# Patient Record
Sex: Male | Born: 1980 | Race: White | Hispanic: No | Marital: Married | Smoking: Former smoker
Health system: Southern US, Community
[De-identification: ages and names within clinical notes are randomized; demographics above are authoritative.]

## PROBLEM LIST (undated history)

## (undated) DIAGNOSIS — T7840XA Allergy, unspecified, initial encounter: Secondary | ICD-10-CM

## (undated) DIAGNOSIS — K219 Gastro-esophageal reflux disease without esophagitis: Secondary | ICD-10-CM

## (undated) HISTORY — DX: Gastro-esophageal reflux disease without esophagitis: K21.9

## (undated) HISTORY — DX: Allergy, unspecified, initial encounter: T78.40XA

---
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
## 2013-04-24 LAB — HEPATIC FUNCTION PANEL
ALT: 25 (ref 10–40)
AST: 24 (ref 14–40)
Alkaline Phosphatase: 54 (ref 25–125)
Bilirubin, Total: 0.5

## 2013-04-24 LAB — CBC AND DIFFERENTIAL
HCT: 41 (ref 41–53)
Hemoglobin: 14.5 (ref 13.5–17.5)
Platelets: 248 (ref 150–399)
WBC: 6.6

## 2013-04-24 LAB — TSH: TSH: 1.7 (ref 0.41–5.90)

## 2013-04-24 LAB — BASIC METABOLIC PANEL
BUN: 17 (ref 4–21)
CO2: 23 — AB (ref 13–22)
Chloride: 105 (ref 99–108)
Creatinine: 1.1 (ref 0.6–1.3)
Glucose: 93
Potassium: 4 (ref 3.4–5.3)
Sodium: 138 (ref 137–147)

## 2013-04-24 LAB — LIPID PANEL
Cholesterol: 143 (ref 0–200)
HDL: 41 (ref 35–70)
LDL Cholesterol: 88
Triglycerides: 71 (ref 40–160)

## 2013-04-24 LAB — COMPREHENSIVE METABOLIC PANEL
Albumin: 4.6 (ref 3.5–5.0)
Calcium: 9.6 (ref 8.7–10.7)
GFR calc non Af Amer: 78

## 2014-07-07 LAB — COMPREHENSIVE METABOLIC PANEL
Albumin: 4.5 (ref 3.5–5.0)
Calcium: 10.1 (ref 8.7–10.7)
GFR calc non Af Amer: 70

## 2014-07-07 LAB — LIPID PANEL
Cholesterol: 144 (ref 0–200)
HDL: 46 (ref 35–70)
LDL Cholesterol: 79
Triglycerides: 93 (ref 40–160)

## 2014-07-07 LAB — CBC AND DIFFERENTIAL
HCT: 42 (ref 41–53)
Hemoglobin: 14.4 (ref 13.5–17.5)
Platelets: 227 (ref 150–399)
WBC: 6.3

## 2014-07-07 LAB — BASIC METABOLIC PANEL
BUN: 24 — AB (ref 4–21)
CO2: 23 — AB (ref 13–22)
Chloride: 105 (ref 99–108)
Creatinine: 1.2 (ref 0.6–1.3)
Glucose: 80
Potassium: 4 (ref 3.4–5.3)
Sodium: 140 (ref 137–147)

## 2014-07-07 LAB — HEPATIC FUNCTION PANEL
ALT: 28 (ref 10–40)
AST: 28 (ref 14–40)
Alkaline Phosphatase: 74 (ref 25–125)
Bilirubin, Total: 0.5

## 2014-07-07 LAB — TSH: TSH: 1.59 (ref 0.41–5.90)

## 2015-01-25 HISTORY — PX: VASECTOMY: SHX75

## 2015-06-29 LAB — COMPREHENSIVE METABOLIC PANEL
Albumin: 4.5 (ref 3.5–5.0)
Calcium: 9.6 (ref 8.7–10.7)

## 2015-06-29 LAB — HEPATIC FUNCTION PANEL
ALT: 31 (ref 10–40)
AST: 27 (ref 14–40)
Alkaline Phosphatase: 65 (ref 25–125)
Bilirubin, Total: 0.7

## 2015-06-29 LAB — BASIC METABOLIC PANEL
BUN: 17 (ref 4–21)
CO2: 23 — AB (ref 13–22)
Chloride: 104 (ref 99–108)
Creatinine: 1 (ref 0.6–1.3)
Glucose: 92
Potassium: 3.7 (ref 3.4–5.3)
Sodium: 139 (ref 137–147)

## 2015-06-29 LAB — LIPID PANEL
Cholesterol: 159 (ref 0–200)
HDL: 48 (ref 35–70)
LDL Cholesterol: 94
Triglycerides: 84 (ref 40–160)

## 2015-06-29 LAB — CBC AND DIFFERENTIAL
HCT: 42 (ref 41–53)
Hemoglobin: 14.2 (ref 13.5–17.5)
Neutrophils Absolute: 5
Platelets: 201 (ref 150–399)
WBC: 7.3

## 2015-06-29 LAB — TSH: TSH: 2.41 (ref 0.41–5.90)

## 2015-06-29 LAB — CBC: RBC: 4.65 (ref 3.87–5.11)

## 2016-08-17 LAB — LIPID PANEL
Cholesterol: 170 (ref 0–200)
HDL: 48 (ref 35–70)
LDL Cholesterol: 104
Triglycerides: 92 (ref 40–160)

## 2016-08-17 LAB — COMPREHENSIVE METABOLIC PANEL
Albumin: 4.5 (ref 3.5–5.0)
Calcium: 10.1 (ref 8.7–10.7)

## 2016-08-17 LAB — CBC AND DIFFERENTIAL
HCT: 43 (ref 41–53)
Hemoglobin: 14.8 (ref 13.5–17.5)
Neutrophils Absolute: 3
Platelets: 220 (ref 150–399)
WBC: 5.8

## 2016-08-17 LAB — HEPATIC FUNCTION PANEL
ALT: 30 (ref 10–40)
AST: 31 (ref 14–40)
Alkaline Phosphatase: 55 (ref 25–125)
Bilirubin, Total: 0.6

## 2016-08-17 LAB — BASIC METABOLIC PANEL
BUN: 21 (ref 4–21)
CO2: 26 — AB (ref 13–22)
Chloride: 103 (ref 99–108)
Creatinine: 1.1 (ref 0.6–1.3)
Glucose: 88
Potassium: 4.8 (ref 3.4–5.3)
Sodium: 139 (ref 137–147)

## 2016-08-17 LAB — TSH: TSH: 1.74 (ref 0.41–5.90)

## 2016-08-17 LAB — CBC: RBC: 4.73 (ref 3.87–5.11)

## 2017-11-29 LAB — CBC AND DIFFERENTIAL
HCT: 42 (ref 41–53)
Hemoglobin: 13.9 (ref 13.5–17.5)
Neutrophils Absolute: 4
Platelets: 240 (ref 150–399)
WBC: 5.9

## 2017-11-29 LAB — HEPATIC FUNCTION PANEL
ALT: 16 (ref 10–40)
AST: 26 (ref 14–40)
Alkaline Phosphatase: 53 (ref 25–125)
Bilirubin, Total: 0.3

## 2017-11-29 LAB — COMPREHENSIVE METABOLIC PANEL
Albumin: 4.7 (ref 3.5–5.0)
Calcium: 9.8 (ref 8.7–10.7)

## 2017-11-29 LAB — BASIC METABOLIC PANEL
BUN: 21 (ref 4–21)
CO2: 21 (ref 13–22)
Chloride: 106 (ref 99–108)
Creatinine: 0.9 (ref 0.6–1.3)
Glucose: 98
Potassium: 4.1 (ref 3.4–5.3)
Sodium: 140 (ref 137–147)

## 2017-11-29 LAB — LIPID PANEL
Cholesterol: 167 (ref 0–200)
HDL: 50 (ref 35–70)
LDL Cholesterol: 101
Triglycerides: 78 (ref 40–160)

## 2017-11-29 LAB — CBC: RBC: 4.55 (ref 3.87–5.11)

## 2017-11-29 LAB — TSH: TSH: 1.31 (ref 0.41–5.90)

## 2019-07-03 ENCOUNTER — Ambulatory Visit (INDEPENDENT_AMBULATORY_CARE_PROVIDER_SITE_OTHER): Payer: Commercial Managed Care - PPO | Admitting: Family Medicine

## 2019-07-03 ENCOUNTER — Other Ambulatory Visit: Payer: Self-pay

## 2019-07-03 ENCOUNTER — Encounter: Payer: Self-pay | Admitting: Family Medicine

## 2019-07-03 VITALS — BP 116/80 | HR 69 | Temp 97.8°F | Resp 16 | Ht 71.0 in | Wt 176.6 lb

## 2019-07-03 DIAGNOSIS — K219 Gastro-esophageal reflux disease without esophagitis: Secondary | ICD-10-CM

## 2019-07-03 DIAGNOSIS — J3089 Other allergic rhinitis: Secondary | ICD-10-CM

## 2019-07-03 DIAGNOSIS — Z7689 Persons encountering health services in other specified circumstances: Secondary | ICD-10-CM | POA: Diagnosis not present

## 2019-07-03 MED ORDER — ESOMEPRAZOLE MAGNESIUM 40 MG PO CPDR: 40.0000 mg | DELAYED_RELEASE_CAPSULE | Freq: Every day | ORAL | 3 refills | Status: DC

## 2019-07-03 NOTE — Progress Notes (Signed)
Subjective:    Patient ID: Reginald Yoder, male    DOB: 06-23-1980, 39 y.o.   MRN: [id number]  Reginald Yoder is a 39 y.o. male presenting on 07/03/2019 for Establish Care (GERD)  Moved here to [facility] in January 2021 from [place], previous PCP was Lupita Shutter NP ([place])  HPI   GERD Past history initially in 2009 diagnosed. He has had upper GI endoscopy done in 09-10 in [place], no repeat done since then. Confirmed his diagnosis. No complications that would warrant repeat. He has not had any issues with this GERD since on medication. Previous symptoms with heartburn and chest tightness with physical activity and build up of gas. Improved with belching. He was on Zantac BID for past 9 years, then he switched to Omeprazole 20mg  - then wasn't covered. He was switched to generic Esomeprazole 20mg  daily but was ineffective and increased to 40mg  daily about 1.5 years ago, and now doing well. - He has tried to adjust his diet but didn't quite resolve the problem. - He has some fam history with similar problem GERD, grandfather and aunts  History of Sinusitis / Allergies Usually October seasonally would get sinus infection.  Last lab panel biometric in approx Feb 2020.  He currently works at [employer], he was working at [employer] in [place] previously now new job.  Wife has an aunt in [place] His family has an aunt in [place]  Generally active. He does like to keep at gym and do strength training. Goal to go hiking with wife outdoors. He is active playing sports basketball  History of COVID19 02/2019  Health Maintenance: No updated COVID19  Tdap 2018-2019, review record.  Depression screen PHQ 2/9 07/03/2019  Decreased Interest 0  Down, Depressed, Hopeless 0  PHQ - 2 Score 0    Past Medical History:  Diagnosis Date  . Allergy   . GERD (gastroesophageal reflux disease)    Past Surgical History:  Procedure  Laterality Date  . TONSILECTOMY, ADENOIDECTOMY, BILATERAL MYRINGOTOMY AND TUBES    . VASECTOMY    . WISDOM TOOTH EXTRACTION     Social History   Socioeconomic History  . Marital status: Married    Spouse name: Not on file  . Number of children: Not on file  . Years of education: Not on file  . Highest education level: Not on file  Occupational History  . Not on file  Tobacco Use  . Smoking status: Former Research scientist (life sciences)  . Smokeless tobacco: Former Systems developer    Types: Chew  Substance and Sexual Activity  . Alcohol use: Yes  . Drug use: Not Currently    Types: Marijuana  . Sexual activity: Not on file  Other Topics Concern  . Not on file  Social History Narrative  . Not on file   Social Determinants of Health   Financial Resource Strain:   . Difficulty of Paying Living Expenses:   Food Insecurity:   . Worried About Charity fundraiser in the Last Year:   . Arboriculturist in the Last Year:   Transportation Needs:   . Film/video editor (Medical):   Marland Kitchen Lack of Transportation (Non-Medical):   Physical Activity:   . Days of Exercise per Week:   . Minutes of Exercise per Session:   Stress:   . Feeling of Stress :   Social Connections:   . Frequency of Communication with Friends and Family:   .  Frequency of Social Gatherings with Friends and Family:   . Attends Religious Services:   . Active Member of Clubs or Organizations:   . Attends Banker Meetings:   Marland Kitchen Marital Status:   Intimate Partner Violence:   . Fear of Current or Ex-Partner:   . Emotionally Abused:   Marland Kitchen Physically Abused:   . Sexually Abused:    Family History  Problem Relation Age of Onset  . Lung cancer Maternal Grandfather 53       smoker  . Heart attack Maternal Grandfather   . Prostate cancer Neg Hx   . Colon cancer Neg Hx    Current Outpatient Medications on File Prior to Visit  Medication Sig  . cetirizine (ZYRTEC) 5 MG tablet Take 5 mg by mouth daily.   No current  facility-administered medications on file prior to visit.    Review of Systems  Constitutional: Negative for activity change, appetite change, chills, diaphoresis, fatigue and fever.  HENT: Negative for congestion and hearing loss.   Eyes: Negative for visual disturbance.  Respiratory: Negative for apnea, cough, chest tightness, shortness of breath and wheezing.   Cardiovascular: Negative for chest pain, palpitations and leg swelling.  Gastrointestinal: Negative for abdominal pain, anal bleeding, blood in stool, constipation, diarrhea, nausea and vomiting.       Heartburn controlled  Endocrine: Negative for cold intolerance.  Genitourinary: Negative for difficulty urinating, dysuria, frequency and hematuria.  Musculoskeletal: Negative for arthralgias, back pain and neck pain.  Skin: Negative for rash.  Allergic/Immunologic: Positive for environmental allergies.  Neurological: Negative for dizziness, weakness, light-headedness, numbness and headaches.  Hematological: Negative for adenopathy.  Psychiatric/Behavioral: Negative for behavioral problems, dysphoric mood and sleep disturbance. The patient is not nervous/anxious.    Per HPI unless specifically indicated above      Objective:    BP 116/80   Pulse 69   Temp 97.8 F (36.6 C) (Temporal)   Resp 16   Ht 5\' 11"  (1.803 m)   Wt 176 lb 9.6 oz (80.1 kg)   SpO2 99%   BMI 24.63 kg/m   Wt Readings from Last 3 Encounters:  07/03/19 176 lb 9.6 oz (80.1 kg)    Physical Exam Vitals and nursing note reviewed.  Constitutional:      General: He is not in acute distress.    Appearance: He is well-developed. He is not diaphoretic.     Comments: Well-appearing, comfortable, cooperative  HENT:     Head: Normocephalic and atraumatic.  Eyes:     General:        Right eye: No discharge.        Left eye: No discharge.     Conjunctiva/sclera: Conjunctivae normal.     Pupils: Pupils are equal, round, and reactive to light.  Neck:      Thyroid: No thyromegaly.  Cardiovascular:     Rate and Rhythm: Normal rate and regular rhythm.     Heart sounds: Normal heart sounds. No murmur.  Pulmonary:     Effort: Pulmonary effort is normal. No respiratory distress.     Breath sounds: Normal breath sounds. No wheezing or rales.  Abdominal:     General: Bowel sounds are normal. There is no distension.     Palpations: Abdomen is soft. There is no mass.     Tenderness: There is no abdominal tenderness.  Musculoskeletal:        General: No tenderness. Normal range of motion.     Cervical back: Normal range  of motion and neck supple.     Comments: Upper / Lower Extremities: - Normal muscle tone, strength bilateral upper extremities 5/5, lower extremities 5/5  Lymphadenopathy:     Cervical: No cervical adenopathy.  Skin:    General: Skin is warm and dry.     Findings: No erythema or rash.  Neurological:     Mental Status: He is alert and oriented to person, place, and time.     Comments: Distal sensation intact to light touch all extremities  Psychiatric:        Behavior: Behavior normal.     Comments: Well groomed, good eye contact, normal speech and thoughts        No results found for this or any previous visit.    Assessment & Plan:   Problem List Items Addressed This Visit    Gastroesophageal reflux disease - Primary    Stable chronic GERD, controlled on PPI Prior GI [place], EGD initial 2009-2010. No complications. Can request record if need. No repeat required based on his report.  Plan: 1. Continue generic Esomeprazole 40mg  daily before breakfast 90 +3 refill to [facility] 2. Diet modifications reduce GERD 3. Reviewed treatment goal with PPI, may continue longer term if indicated. Future can reconsider dosing and med and consider refer to local GI for consultation if need.      Relevant Medications   esomeprazole (NEXIUM) 40 MG capsule   Environmental and seasonal allergies    Other Visit Diagnoses     Encounter to establish care with new doctor          Request records from outside PCP   Meds ordered this encounter  Medications  . esomeprazole (NEXIUM) 40 MG capsule    Sig: Take 1 capsule (40 mg total) by mouth daily before breakfast.    Dispense:  90 capsule    Refill:  3     Follow up plan: Return in about 1 year (around 07/02/2020) for Annual Physical.   May return sooner after July 2021 after [facility] for work, initial labs, may warrant lab draw here after follow-up and review of his record. Or can resume yearly follow-up in 2022.  2023, DO [facility] 07/03/2019, 3:38 PM

## 2019-07-03 NOTE — Patient Instructions (Addendum)
Thank you for coming to the office today.  Ordered generic nexium, 90 day + 3 refills [facility]   Please schedule a Follow-up Appointment to: Return in about 1 year (around 07/02/2020) for Annual Physical.  If you have any other questions or concerns, please feel free to call the office or send a message through MyChart. You may also schedule an earlier appointment if necessary.  Additionally, you may be receiving a survey about your experience at our office within a few days to 1 week by e-mail or mail. We value your feedback.  Saralyn Pilar, DO [facility], [place]

## 2019-07-03 NOTE — Assessment & Plan Note (Signed)
Stable chronic GERD, controlled on PPI Prior GI [place], EGD initial 2009-2010. No complications. Can request record if need. No repeat required based on his report.  Plan: 1. Continue generic Esomeprazole 40mg  daily before breakfast 90 +3 refill to [facility] 2. Diet modifications reduce GERD 3. Reviewed treatment goal with PPI, may continue longer term if indicated. Future can reconsider dosing and med and consider refer to local GI for consultation if need.

## 2019-07-14 ENCOUNTER — Encounter: Payer: Self-pay | Admitting: Family Medicine

## 2019-07-14 DIAGNOSIS — H33311 Horseshoe tear of retina without detachment, right eye: Secondary | ICD-10-CM | POA: Insufficient documentation

## 2019-08-14 LAB — LIPID PANEL
Cholesterol: 188 (ref 0–200)
HDL: 44 (ref 35–70)
LDL Cholesterol: 125
LDl/HDL Ratio: 4.3
Triglycerides: 97 (ref 40–160)

## 2019-08-14 LAB — BASIC METABOLIC PANEL: Glucose: 93

## 2019-10-10 ENCOUNTER — Other Ambulatory Visit: Payer: Self-pay

## 2019-10-10 ENCOUNTER — Ambulatory Visit (INDEPENDENT_AMBULATORY_CARE_PROVIDER_SITE_OTHER): Payer: Commercial Managed Care - PPO

## 2019-10-10 ENCOUNTER — Ambulatory Visit
Admission: EM | Admit: 2019-10-10 | Discharge: 2019-10-10 | Disposition: A | Discharge status: Home / Self Care | Payer: Commercial Managed Care - PPO | Attending: Family Medicine | Admitting: Family Medicine

## 2019-10-10 DIAGNOSIS — S90111A Contusion of right great toe without damage to nail, initial encounter: Secondary | ICD-10-CM

## 2019-10-10 DIAGNOSIS — M79674 Pain in right toe(s): Secondary | ICD-10-CM

## 2019-10-10 MED ORDER — MELOXICAM 15 MG PO TABS: 15.0000 mg | ORAL_TABLET | Freq: Every day | ORAL | 0 refills | Status: DC | PRN

## 2019-10-10 NOTE — ED Triage Notes (Signed)
Patient in today w/ right foot injury. Patient states he was at the pool and slipped on the deck yesterday.

## 2019-10-10 NOTE — Discharge Instructions (Addendum)
Rest, ice, elevation. ° °Medication as directed. ° °Take care ° °Dr. Talmage Teaster  °

## 2019-10-11 ENCOUNTER — Encounter: Payer: Self-pay | Admitting: Family Medicine

## 2019-10-11 NOTE — ED Provider Notes (Signed)
[facility]    CSN: [id number] Arrival date & time: 10/10/19  1617  History   Chief Complaint Chief Complaint  Patient presents with  . Foot Injury    Right   HPI  39 year old male presents with the above complaint.  Patient reports that he was at the pool yesterday.  He slipped and in doing so he injured his right great toe.  Pain 2/10 in severity. Associated bruising. No relieving factors. No other complaints.    Past Medical History:  Diagnosis Date  . Allergy   . GERD (gastroesophageal reflux disease)    Patient Active Problem List   Diagnosis Date Noted  . Partial retinal tear of right eye without detachment 07/14/2019  . Gastroesophageal reflux disease 07/03/2019  . Environmental and seasonal allergies 07/03/2019   Past Surgical History:  Procedure Laterality Date  . TONSILLECTOMY Bilateral 1987  . VASECTOMY  2017  . WISDOM TOOTH EXTRACTION Bilateral 2002    Home Medications    Prior to Admission medications   Medication Sig Start Date End Date Taking? Authorizing Provider  cetirizine (ZYRTEC) 5 MG tablet Take 5 mg by mouth daily.   Yes [provider]  esomeprazole (NEXIUM) 40 MG capsule Take 1 capsule (40 mg total) by mouth daily before breakfast. 07/03/19  Yes Karamalegos, Netta Neat, DO  meloxicam (MOBIC) 15 MG tablet Take 1 tablet (15 mg total) by mouth daily as needed for pain. 10/10/19   Tommie Sams, DO    Family History Family History  Problem Relation Age of Onset  . Lung cancer Maternal Grandfather 7       smoker  . Heart attack Maternal Grandfather   . Prostate cancer Neg Hx   . Colon cancer Neg Hx     Social History Social History   Tobacco Use  . Smoking status: Former Games developer  . Smokeless tobacco: Former Neurosurgeon    Types: Chew  . Tobacco comment: chewing tobacco for 1 year  Substance Use Topics  . Alcohol use: Yes    Alcohol/week: 1.0 - 2.0 standard drink    Types: 1 - 2 Standard drinks or equivalent per week    . Drug use: Not Currently    Types: Marijuana     Allergies   Seasonal ic [cholestatin]   Review of Systems Review of Systems  Constitutional: Negative.   Musculoskeletal:       Great toe pain/injury (right).   Physical Exam Triage Vital Signs ED Triage Vitals  Enc Vitals Group     BP 10/10/19 1649 (!) 127/98     Pulse Rate 10/10/19 1649 62     Resp 10/10/19 1649 16     Temp 10/10/19 1649 98.3 F (36.8 C)     Temp Source 10/10/19 1649 Oral     SpO2 10/10/19 1649 100 %     Weight 10/10/19 1647 175 lb (79.4 kg)     Height 10/10/19 1647 5\' 11"  (1.803 m)     Head Circumference --      Peak Flow --      Pain Score 10/10/19 1647 2     Pain Loc --      Pain Edu? --      Excl. in GC? --    Updated Vital Signs BP (!) 127/98 (BP Location: Left Arm)   Pulse 62   Temp 98.3 F (36.8 C) (Oral)   Resp 16   Ht 5\' 11"  (1.803 m)   Wt 79.4 kg  SpO2 100%   BMI 24.41 kg/m   Visual Acuity Right Eye Distance:   Left Eye Distance:   Bilateral Distance:    Right Eye Near:   Left Eye Near:    Bilateral Near:     Physical Exam Vitals and nursing note reviewed.  Constitutional:      General: He is not in acute distress.    Appearance: Normal appearance. He is not ill-appearing.  HENT:     Head: Normocephalic and atraumatic.  Pulmonary:     Effort: Pulmonary effort is normal. No respiratory distress.  Musculoskeletal:     Comments: Right great toe - bruising noted on the dorsum of the toe. Normal ROM.   Neurological:     Mental Status: He is alert.  Psychiatric:        Mood and Affect: Mood normal.        Behavior: Behavior normal.    UC Treatments / Results  Labs (all labs ordered are listed, but only abnormal results are displayed) Labs Reviewed - No data to display  EKG   Radiology DG Toe Great Right  Result Date: 10/10/2019 CLINICAL DATA:  Fall, pain EXAM: RIGHT GREAT TOE COMPARISON:  None. FINDINGS: There is no evidence of fracture or dislocation. There  is no evidence of arthropathy or other focal bone abnormality. Soft tissue edema about the digit. IMPRESSION: No fracture or dislocation of the right great toe. Soft tissue edema. Electronically Signed   By: Lauralyn Primes M.D.   On: 10/10/2019 18:30    Procedures Procedures (including critical care time)  Medications Ordered in UC Medications - No data to display  Initial Impression / Assessment and Plan / UC Course  I have reviewed the triage vital signs and the nursing notes.  Pertinent labs & imaging results that were available during my care of the patient were reviewed by me and considered in my medical decision making (see chart for details).    39 year old male presents with a contusion of the right great toe.  X-ray was obtained today to ensure no fracture.  Interpretation: No acute findings.  No fracture.  Advise rest, ice, elevation.  Meloxicam as directed.  Supportive care.  Final Clinical Impressions(s) / UC Diagnoses   Final diagnoses:  Contusion of right great toe without damage to nail, initial encounter     Discharge Instructions     Rest, ice, elevation.  Medication as directed.  Take care  Dr. Adriana Simas    ED Prescriptions    Medication Sig Dispense Auth. Provider   meloxicam (MOBIC) 15 MG tablet Take 1 tablet (15 mg total) by mouth daily as needed for pain. 30 tablet Tommie Sams, DO     PDMP not reviewed this encounter.   Tommie Sams, [place] 10/11/19 1235

## 2020-05-27 ENCOUNTER — Other Ambulatory Visit: Payer: Self-pay

## 2020-05-27 ENCOUNTER — Ambulatory Visit
Admission: EM | Admit: 2020-05-27 | Discharge: 2020-05-27 | Disposition: A | Discharge status: Home / Self Care | Payer: Commercial Managed Care - PPO | Attending: Family Medicine | Admitting: Family Medicine

## 2020-05-27 ENCOUNTER — Encounter: Payer: Self-pay | Admitting: Emergency Medicine

## 2020-05-27 DIAGNOSIS — R350 Frequency of micturition: Secondary | ICD-10-CM | POA: Insufficient documentation

## 2020-05-27 LAB — URINALYSIS, COMPLETE (UACMP) WITH MICROSCOPIC
Bacteria, UA: NONE SEEN
Glucose, UA: NEGATIVE mg/dL
Hgb urine dipstick: NEGATIVE
Ketones, ur: NEGATIVE mg/dL
Leukocytes,Ua: NEGATIVE
Protein, ur: NEGATIVE mg/dL
Specific Gravity, Urine: >1.03 — ABNORMAL HIGH (ref 1.005–1.030)
Squamous Epithelial / HPF: NONE SEEN (ref 0–5)
pH: 5.5 (ref 5.0–8.0)

## 2020-05-27 NOTE — Discharge Instructions (Signed)
Lots of fluids.  Awaiting culture results.  Take care  Dr. Adriana Simas

## 2020-05-27 NOTE — ED Provider Notes (Signed)
[facility]    CSN: [id number] Arrival date & time: 05/27/20  1850      History   Chief Complaint Chief Complaint  Patient presents with  . Dysuria   HPI  40 year old male presents with urinary frequency.  Patient reports symptoms for the past 2 days.  Notes frequent urination.  Some mild discomfort when he urinates.  He has taken Azo without relief.  Denies concerns for STD.  No relieving factors.  No known inciting factor.  No other complaints.  Past Medical History:  Diagnosis Date  . Allergy   . GERD (gastroesophageal reflux disease)     Patient Active Problem List   Diagnosis Date Noted  . Partial retinal tear of right eye without detachment 07/14/2019  . Gastroesophageal reflux disease 07/03/2019  . Environmental and seasonal allergies 07/03/2019    Past Surgical History:  Procedure Laterality Date  . TONSILLECTOMY Bilateral 1987  . VASECTOMY  2017  . WISDOM TOOTH EXTRACTION Bilateral 2002       Home Medications    Prior to Admission medications   Medication Sig Start Date End Date Taking? Authorizing Provider  cetirizine (ZYRTEC) 5 MG tablet Take 5 mg by mouth daily.    [provider]  esomeprazole (NEXIUM) 40 MG capsule Take 1 capsule (40 mg total) by mouth daily before breakfast. 07/03/19   Althea Charon Netta Neat, DO    Family History Family History  Problem Relation Age of Onset  . Lung cancer Maternal Grandfather 45       smoker  . Heart attack Maternal Grandfather   . Prostate cancer Neg Hx   . Colon cancer Neg Hx     Social History Social History   Tobacco Use  . Smoking status: Former Games developer  . Smokeless tobacco: Former Neurosurgeon    Types: Chew  . Tobacco comment: chewing tobacco for 1 year  Substance Use Topics  . Alcohol use: Yes    Alcohol/week: 1.0 - 2.0 standard drink    Types: 1 - 2 Standard drinks or equivalent per week  . Drug use: Not Currently    Types: Marijuana     Allergies   Seasonal ic  [cholestatin]   Review of Systems Review of Systems  Constitutional: Negative.   Genitourinary: Positive for frequency.   Physical Exam Triage Vital Signs ED Triage Vitals  Enc Vitals Group     BP 05/27/20 1904 120/86     Pulse Rate 05/27/20 1904 (!) 54     Resp 05/27/20 1904 17     Temp 05/27/20 1904 97.8 F (36.6 C)     Temp src --      SpO2 05/27/20 1904 100 %     Weight --      Height --      Head Circumference --      Peak Flow --      Pain Score 05/27/20 1903 0     Pain Loc --      Pain Edu? --      Excl. in GC? --    Updated Vital Signs BP 120/86 (BP Location: Left Arm)   Pulse (!) 54   Temp 97.8 F (36.6 C)   Resp 17   SpO2 100%   Visual Acuity Right Eye Distance:   Left Eye Distance:   Bilateral Distance:    Right Eye Near:   Left Eye Near:    Bilateral Near:     Physical Exam Vitals and nursing note reviewed.  Constitutional:      General: He is not in acute distress.    Appearance: Normal appearance. He is not ill-appearing.  HENT:     Head: Normocephalic and atraumatic.  Eyes:     General:        Right eye: No discharge.        Left eye: No discharge.     Conjunctiva/sclera: Conjunctivae normal.  Cardiovascular:     Rate and Rhythm: Regular rhythm. Bradycardia present.  Pulmonary:     Effort: Pulmonary effort is normal.     Breath sounds: Normal breath sounds. No wheezing, rhonchi or rales.  Abdominal:     General: There is no distension.     Palpations: Abdomen is soft.     Tenderness: There is no abdominal tenderness.  Neurological:     Mental Status: He is alert.  Psychiatric:        Mood and Affect: Mood normal.        Behavior: Behavior normal.    UC Treatments / Results  Labs (all labs ordered are listed, but only abnormal results are displayed) Labs Reviewed  URINALYSIS, COMPLETE (UACMP) WITH MICROSCOPIC - Abnormal; Notable for the following components:      Result Value   Color, Urine AMBER (*)    Specific Gravity,  Urine >1.030 (*)    Bilirubin Urine SMALL (*)    Nitrite   (*)    Value: TEST NOT REPORTED DUE TO COLOR INTERFERENCE OF URINE PIGMENT   All other components within normal limits  URINE CULTURE    EKG   Radiology No results found.  Procedures Procedures (including critical care time)  Medications Ordered in UC Medications - No data to display  Initial Impression / Assessment and Plan / UC Course  I have reviewed the triage vital signs and the nursing notes.  Pertinent labs & imaging results that were available during my care of the patient were reviewed by me and considered in my medical decision making (see chart for details).    40 year old male presents with urinary frequency.  His urinalysis is not consistent with UTI.  Sending culture.  Advise hydration.  Supportive care.  Final Clinical Impressions(s) / UC Diagnoses   Final diagnoses:  Urinary frequency     Discharge Instructions     Lots of fluids.  Awaiting culture results.  Take care  Dr. Adriana Simas    ED Prescriptions    None     PDMP not reviewed this encounter.   Tommie Sams, [place] 05/27/20 1950

## 2020-05-27 NOTE — ED Triage Notes (Signed)
Pt is present today with frequent urination and dysuria. Pt states his sx started a couple days ago.

## 2020-05-29 LAB — URINE CULTURE: Culture: NO GROWTH

## 2020-07-06 ENCOUNTER — Other Ambulatory Visit: Payer: Self-pay | Admitting: Family Medicine

## 2020-07-06 DIAGNOSIS — K219 Gastro-esophageal reflux disease without esophagitis: Secondary | ICD-10-CM

## 2020-07-06 NOTE — Telephone Encounter (Signed)
  Last refill: 02/29/2020  Future visit scheduled:no  Notes to clinic:  overdue for office visit Message sent to patient to contact office Review for courtesy refill   Requested Prescriptions  Pending Prescriptions Disp Refills   esomeprazole (NEXIUM) 40 MG capsule [Pharmacy Med Name: ESOMEPRA MAG CAP 40MG  DR] 90 capsule 3    Sig: TAKE 1 CAPSULE DAILY BEFOREBREAKFAST      Gastroenterology: Proton Pump Inhibitors Failed - 07/06/2020  8:07 AM      Failed - Valid encounter within last 12 months    Recent Outpatient Visits           1 year ago Gastroesophageal reflux disease without esophagitis   [facility] [place], [place], DO

## 2020-07-29 ENCOUNTER — Other Ambulatory Visit: Payer: Self-pay | Admitting: Internal Medicine

## 2020-07-29 DIAGNOSIS — K219 Gastro-esophageal reflux disease without esophagitis: Secondary | ICD-10-CM

## 2020-07-29 NOTE — Telephone Encounter (Signed)
  Notes to clinic:  Patient has upcoming appt on 08/27/2020 Review for refill until that time    Requested Prescriptions  Pending Prescriptions Disp Refills   esomeprazole (NEXIUM) 40 MG capsule [Pharmacy Med Name: ESOMEPRA MAG CAP 40MG  DR] 30 capsule 0    Sig: TAKE 1 CAPSULE DAILY BEFOREBREAKFAST      Gastroenterology: Proton Pump Inhibitors Failed - 07/29/2020  8:17 AM      Failed - Valid encounter within last 12 months    Recent Outpatient Visits           1 year ago Gastroesophageal reflux disease without esophagitis   [facility] [place], [place], DO       Future Appointments             In 4 weeks Netta Neat, Althea Charon, DO [facility]

## 2020-08-27 ENCOUNTER — Encounter: Payer: Self-pay | Admitting: Family Medicine

## 2020-08-27 ENCOUNTER — Ambulatory Visit (INDEPENDENT_AMBULATORY_CARE_PROVIDER_SITE_OTHER): Payer: Commercial Managed Care - PPO | Admitting: Family Medicine

## 2020-08-27 ENCOUNTER — Other Ambulatory Visit: Payer: Self-pay

## 2020-08-27 VITALS — BP 117/75 | HR 67 | Ht 71.0 in | Wt 173.6 lb

## 2020-08-27 DIAGNOSIS — K219 Gastro-esophageal reflux disease without esophagitis: Secondary | ICD-10-CM | POA: Diagnosis not present

## 2020-08-27 DIAGNOSIS — Z0000 Encounter for general adult medical examination without abnormal findings: Secondary | ICD-10-CM | POA: Diagnosis not present

## 2020-08-27 MED ORDER — ESOMEPRAZOLE MAGNESIUM 40 MG PO CPDR: 40.0000 mg | DELAYED_RELEASE_CAPSULE | Freq: Every day | ORAL | 3 refills | Status: DC

## 2020-08-27 NOTE — Progress Notes (Signed)
Subjective:    Patient ID: Reginald Yoder, male    DOB: 1980/10/01, 40 y.o.   MRN: [id number]  Reginald Yoder is a 40 y.o. male presenting on 08/27/2020 for Annual Exam   HPI  Here for Annual Physical and Due for fasting lab orders.  GERD History original dx 2009 He has had upper GI endoscopy done in 09-10 in [place], no repeat done since then. Confirmed his diagnosis. No complications that would warrant repeat. He has not had any issues with this GERD since on medication. Previous symptoms with heartburn and chest tightness with physical activity and build up of gas. Improved with belching. He was on Zantac BID for past 9 years, then he switched to Omeprazole 20mg  - then wasn't covered. He was switched to generic Esomeprazole 20mg  daily but was ineffective and increased to 40mg  daily about 1.5 years ago, and now doing well. Controlled on Esomeprazole 40mg  daily needs future refills  History of Sinusitis / Allergies  Lifestyle   Generally active. He does like to keep at gym and do strength training. Goal to go hiking with wife outdoors. He is active playing sports basketball   History of COVID19 02/2019   Health Maintenance: No updated COVID19 vaccine   Tdap 2018-2019, review record.  Declines Hep C   Depression screen [facility] 2/9 08/27/2020 07/03/2019  Decreased Interest 0 0  Down, Depressed, Hopeless 0 0  PHQ - 2 Score 0 0  Altered sleeping 0 -  Tired, decreased energy 0 -  Change in appetite 0 -  Feeling bad or failure about yourself  0 -  Trouble concentrating 0 -  Moving slowly or fidgety/restless 0 -  Suicidal thoughts 0 -  PHQ-9 Score 0 -  Difficult doing work/chores Not difficult at all -   GAD 7 : Generalized Anxiety Score 08/27/2020  Nervous, Anxious, on Edge 0  Control/stop worrying 0  Worry too much - different things 0  Trouble relaxing 0  Restless 0  Easily annoyed or irritable 0  Afraid - awful might happen 0  Total GAD 7 Score 0  Anxiety Difficulty Not  difficult at all      Past Medical History:  Diagnosis Date   Allergy    GERD (gastroesophageal reflux disease)    Past Surgical History:  Procedure Laterality Date   TONSILLECTOMY Bilateral 1987   VASECTOMY  2017   WISDOM TOOTH EXTRACTION Bilateral 2002   Social History   Socioeconomic History   Marital status: Married    Spouse name: Not on file   Number of children: Not on file   Years of education: College   Highest education level: Associate degree: occupational, 09/02/2019, or vocational program  Occupational History   Not on file  Tobacco Use   Smoking status: Former   Smokeless tobacco: Former    Types: Chew   Tobacco comments:    chewing tobacco for 1 year  Substance and Sexual Activity   Alcohol use: Yes    Alcohol/week: 1.0 - 2.0 standard drink    Types: 1 - 2 Standard drinks or equivalent per week   Drug use: Not Currently    Types: Marijuana   Sexual activity: Not on file  Other Topics Concern   Not on file  Social History Narrative   Not on file   Social Determinants of Health   Financial Resource Strain: Not on file  Food Insecurity: Not on file  Transportation Needs: Not on file  Physical Activity: Not on file  Stress: Not on file  Social Connections: Not on file  Intimate Partner Violence: Not on file   Family History  Problem Relation Age of Onset   Lung cancer Maternal Grandfather 62       smoker   Heart attack Maternal Grandfather    Prostate cancer Neg Hx    Colon cancer Neg Hx    Current Outpatient Medications on File Prior to Visit  Medication Sig   cetirizine (ZYRTEC) 5 MG tablet Take 5 mg by mouth daily.   No current facility-administered medications on file prior to visit.    Review of Systems  Constitutional:  Negative for activity change, appetite change, chills, diaphoresis, fatigue and fever.  HENT:  Negative for congestion and hearing loss.   Eyes:  Negative for visual disturbance.  Respiratory:  Negative for  cough, chest tightness, shortness of breath and wheezing.   Cardiovascular:  Negative for chest pain, palpitations and leg swelling.  Gastrointestinal:  Negative for abdominal pain, constipation, diarrhea, nausea and vomiting.  Genitourinary:  Negative for dysuria, frequency and hematuria.  Musculoskeletal:  Negative for arthralgias and neck pain.  Skin:  Negative for rash.  Neurological:  Negative for dizziness, weakness, light-headedness, numbness and headaches.  Hematological:  Negative for adenopathy.  Psychiatric/Behavioral:  Negative for behavioral problems, dysphoric mood and sleep disturbance.   Per HPI unless specifically indicated above      Objective:    BP 117/75   Pulse 67   Ht 5\' 11"  (1.803 m)   Wt 173 lb 9.6 oz (78.7 kg)   SpO2 100%   BMI 24.21 kg/m   Wt Readings from Last 3 Encounters:  08/27/20 173 lb 9.6 oz (78.7 kg)  10/10/19 175 lb (79.4 kg)  07/03/19 176 lb 9.6 oz (80.1 kg)    Physical Exam Vitals and nursing note reviewed.  Constitutional:      General: He is not in acute distress.    Appearance: He is well-developed. He is not diaphoretic.     Comments: Well-appearing, comfortable, cooperative  HENT:     Head: Normocephalic and atraumatic.  Eyes:     General:        Right eye: No discharge.        Left eye: No discharge.     Conjunctiva/sclera: Conjunctivae normal.     Pupils: Pupils are equal, round, and reactive to light.  Neck:     Thyroid: No thyromegaly.  Cardiovascular:     Rate and Rhythm: Normal rate and regular rhythm.     Pulses: Normal pulses.     Heart sounds: Normal heart sounds. No murmur heard. Pulmonary:     Effort: Pulmonary effort is normal. No respiratory distress.     Breath sounds: Normal breath sounds. No wheezing or rales.  Abdominal:     General: Bowel sounds are normal. There is no distension.     Palpations: Abdomen is soft. There is no mass.     Tenderness: There is no abdominal tenderness.  Musculoskeletal:         General: No tenderness. Normal range of motion.     Cervical back: Normal range of motion and neck supple.     Comments: Upper / Lower Extremities: - Normal muscle tone, strength bilateral upper extremities 5/5, lower extremities 5/5  Lymphadenopathy:     Cervical: No cervical adenopathy.  Skin:    General: Skin is warm and dry.     Findings: No erythema or rash.  Neurological:     Mental  Status: He is alert and oriented to person, place, and time.     Comments: Distal sensation intact to light touch all extremities  Psychiatric:        Mood and Affect: Mood normal.        Behavior: Behavior normal.        Thought Content: Thought content normal.     Comments: Well groomed, good eye contact, normal speech and thoughts     Results for orders placed or performed during the hospital encounter of 05/27/20  Urine Culture   Specimen: Urine, Clean Catch  Result Value Ref Range   Specimen Description      URINE, CLEAN CATCH Performed at [facility], [address] [place], [place] [address]    Special Requests      NONE Performed at [facility], [address] [place], [place] [address]    Culture      NO GROWTH Performed at [facility], [address] [place], [place] [address]    Report Status 05/29/2020 FINAL   Urinalysis, Complete w Microscopic Urine, Clean Catch  Result Value Ref Range   Color, Urine AMBER (A) YELLOW   APPearance CLEAR CLEAR   Specific Gravity, Urine >1.030 (H) 1.005 - 1.030   pH 5.5 5.0 - 8.0   Glucose, UA NEGATIVE NEGATIVE mg/dL   Hgb urine dipstick NEGATIVE NEGATIVE   Bilirubin Urine SMALL (A) NEGATIVE   Ketones, ur NEGATIVE NEGATIVE mg/dL   Protein, ur NEGATIVE NEGATIVE mg/dL   Nitrite (A) NEGATIVE    TEST NOT REPORTED DUE TO COLOR INTERFERENCE OF URINE PIGMENT   Leukocytes,Ua NEGATIVE NEGATIVE   Squamous Epithelial / LPF NONE SEEN 0 - 5   WBC, UA 0-5 0 - 5 WBC/hpf   RBC / HPF 0-5 0 - 5 RBC/hpf   Bacteria,  UA NONE SEEN NONE SEEN   Mucus PRESENT       Assessment & Plan:   Problem List Items Addressed This Visit     Gastroesophageal reflux disease   Relevant Medications   esomeprazole (NEXIUM) 40 MG capsule   Other Visit Diagnoses     Annual physical exam    -  Primary   Relevant Orders   COMPLETE METABOLIC PANEL WITH GFR   Lipid panel   CBC with Differential/Platelet       Updated Health Maintenance information Due Flu Shot, COVID shot Hep C screen declined Fasting lab today Encouraged improvement to lifestyle with diet and exercise Goal of weight loss  GERD Controlled on PPI Nexium 40mg  daily, add refills   Meds ordered this encounter  Medications   esomeprazole (NEXIUM) 40 MG capsule    Sig: Take 1 capsule (40 mg total) by mouth daily before breakfast.    Dispense:  90 capsule    Refill:  3    Please add refills on file, patient may not need filled yet. Thank you      Follow up plan: Return in about 1 year (around 08/27/2021) for 1 year Annual Physical in AM fasting lab AFTER.  10/27/2021, DO [facility] 08/27/2020, 9:43 AM

## 2020-08-27 NOTE — Patient Instructions (Addendum)
Thank you for coming to the office today.  Future Hep C screening when ready. Check into this  Stay tuned for lab results on mychart  Keep up good work!  Refilled nexium generic to your mail order, to keep refill on file  DUE for FASTING BLOOD WORK (no food or drink after midnight before the lab appointment, only water or coffee without cream/sugar on the morning of)  SCHEDULE "Lab Only" visit in the morning at the clinic for lab draw in 1 YEAR  - Make sure Lab Only appointment is at about 1 week before your next appointment, so that results will be available  For Lab Results, once available within 2-3 days of blood draw, you can can log in to MyChart online to view your results and a brief explanation. Also, we can discuss results at next follow-up visit.   Please schedule a Follow-up Appointment to: Return in about 1 year (around 08/27/2021) for 1 year Annual Physical in AM fasting lab AFTER.  If you have any other questions or concerns, please feel free to call the office or send a message through MyChart. You may also schedule an earlier appointment if necessary.  Additionally, you may be receiving a survey about your experience at our office within a few days to 1 week by e-mail or mail. We value your feedback.  Saralyn Pilar, DO [facility], [place]

## 2020-08-28 LAB — CBC WITH DIFFERENTIAL/PLATELET
Absolute Monocytes: 479 cells/uL (ref 200–950)
Basophils Absolute: 41 cells/uL (ref 0–200)
Basophils Relative: 0.8 %
Eosinophils Absolute: 209 cells/uL (ref 15–500)
Eosinophils Relative: 4.1 %
HCT: 42.9 % (ref 38.5–50.0)
Hemoglobin: 14.7 g/dL (ref 13.2–17.1)
Lymphs Abs: 1535 cells/uL (ref 850–3900)
MCH: 31.3 pg (ref 27.0–33.0)
MCHC: 34.3 g/dL (ref 32.0–36.0)
MCV: 91.5 fL (ref 80.0–100.0)
MPV: 10.6 fL (ref 7.5–12.5)
Monocytes Relative: 9.4 %
Neutro Abs: 2836 cells/uL (ref 1500–7800)
Neutrophils Relative %: 55.6 %
Platelets: 250 10*3/uL (ref 140–400)
RBC: 4.69 10*6/uL (ref 4.20–5.80)
RDW: 12.4 % (ref 11.0–15.0)
Total Lymphocyte: 30.1 %
WBC: 5.1 10*3/uL (ref 3.8–10.8)

## 2020-08-28 LAB — COMPLETE METABOLIC PANEL WITH GFR
AG Ratio: 2 (calc) (ref 1.0–2.5)
ALT: 10 U/L (ref 9–46)
AST: 17 U/L (ref 10–40)
Albumin: 4.6 g/dL (ref 3.6–5.1)
Alkaline phosphatase (APISO): 58 U/L (ref 36–130)
BUN: 19 mg/dL (ref 7–25)
CO2: 26 mmol/L (ref 20–32)
Calcium: 9.5 mg/dL (ref 8.6–10.3)
Chloride: 105 mmol/L (ref 98–110)
Creat: 1.02 mg/dL (ref 0.60–1.26)
Globulin: 2.3 g/dL (calc) (ref 1.9–3.7)
Glucose, Bld: 86 mg/dL (ref 65–99)
Potassium: 4.4 mmol/L (ref 3.5–5.3)
Sodium: 137 mmol/L (ref 135–146)
Total Bilirubin: 0.6 mg/dL (ref 0.2–1.2)
Total Protein: 6.9 g/dL (ref 6.1–8.1)
eGFR: 96 mL/min/{1.73_m2} (ref >=60)

## 2020-08-28 LAB — LIPID PANEL
Cholesterol: 184 mg/dL (ref <200)
HDL: 48 mg/dL (ref >=40)
LDL Cholesterol (Calc): 120 mg/dL (calc) — ABNORMAL HIGH
Non-HDL Cholesterol (Calc): 136 mg/dL (calc) — ABNORMAL HIGH (ref <130)
Total CHOL/HDL Ratio: 3.8 (calc) (ref <5.0)
Triglycerides: 64 mg/dL (ref <150)

## 2020-10-27 ENCOUNTER — Ambulatory Visit
Admission: EM | Admit: 2020-10-27 | Discharge: 2020-10-27 | Disposition: A | Discharge status: Home / Self Care | Payer: Commercial Managed Care - PPO | Attending: Emergency Medicine | Admitting: Emergency Medicine

## 2020-10-27 ENCOUNTER — Other Ambulatory Visit: Payer: Self-pay

## 2020-10-27 DIAGNOSIS — J069 Acute upper respiratory infection, unspecified: Secondary | ICD-10-CM

## 2020-10-27 DIAGNOSIS — R051 Acute cough: Secondary | ICD-10-CM | POA: Diagnosis not present

## 2020-10-27 MED ORDER — IPRATROPIUM BROMIDE 0.06 % NA SOLN: 2.0000 | Freq: Four times a day (QID) | NASAL | 12 refills | Status: DC

## 2020-10-27 MED ORDER — AMOXICILLIN-POT CLAVULANATE 875-125 MG PO TABS: 1.0000 | ORAL_TABLET | Freq: Two times a day (BID) | ORAL | 0 refills | Status: AC

## 2020-10-27 MED ORDER — PROMETHAZINE-DM 6.25-15 MG/5ML PO SYRP: 5.0000 mL | ORAL_SOLUTION | Freq: Four times a day (QID) | ORAL | 0 refills | Status: DC | PRN

## 2020-10-27 NOTE — Discharge Instructions (Addendum)
The Augmentin twice daily with food for 10 days for treatment of your upper respiratory infection.  Perform sinus irrigation 2-3 times a day with a NeilMed sinus rinse kit and distilled water.  Do not use tap water.  You can use plain over-the-counter Mucinex every 6 hours to break up the stickiness of the mucus so your body can clear it.  Increase your oral fluid intake to thin out your mucus so that is also able for your body to clear more easily.  Take an over-the-counter probiotic, such as Culturelle-align-activia, 1 hour after each dose of antibiotic to prevent diarrhea.  Use the Atrovent nasal spray, 2 squirts in each nostril every 6 hours, as needed for runny nose and postnasal drip.  Use the Promethazine DM cough syrup at bedtime for cough and congestion.  It will make you drowsy so do not take it during the day.  Return for reevaluation or see your primary care provider for any new or worsening symptoms.   If you develop any new or worsening symptoms return for reevaluation or see your primary care provider.

## 2020-10-27 NOTE — ED Provider Notes (Signed)
[facility]    CSN: [id number] Arrival date & time: 10/27/20  1225      History   Chief Complaint Chief Complaint  Patient presents with   Cough   Nasal Congestion    HPI Heinrich Fertig is a 40 y.o. male.   HPI  40 year old male here for evaluation of respiratory complaints.  Patient reports that for the last 6 days he has been experiencing headache, sneezing, nasal congestion with a clear to yellow nasal discharge, earache, hoarseness, and a productive cough for yellow sputum.  He states his cough is mostly present in the mornings and gets better without the day.  He also endorses sore throat.  He denies any fever, shortness breath or wheezing, or GI complaints.  Patient reports that he had similar symptoms 2 weeks ago that he thought resolved but then these returned.  Past Medical History:  Diagnosis Date   Allergy    GERD (gastroesophageal reflux disease)     Patient Active Problem List   Diagnosis Date Noted   Partial retinal tear of right eye without detachment 07/14/2019   Gastroesophageal reflux disease 07/03/2019   Environmental and seasonal allergies 07/03/2019    Past Surgical History:  Procedure Laterality Date   TONSILLECTOMY Bilateral 1987   VASECTOMY  2017   WISDOM TOOTH EXTRACTION Bilateral 2002       Home Medications    Prior to Admission medications   Medication Sig Start Date End Date Taking? Authorizing Provider  amoxicillin-clavulanate (AUGMENTIN) 875-125 MG tablet Take 1 tablet by mouth every 12 (twelve) hours for 10 days. 10/27/20 11/06/20 Yes Margarette Canada, NP  esomeprazole (NEXIUM) 40 MG capsule Take 1 capsule (40 mg total) by mouth daily before breakfast. 08/27/20  Yes Karamalegos, Devonne Doughty, DO  ipratropium (ATROVENT) 0.06 % nasal spray Place 2 sprays into both nostrils 4 (four) times daily. 10/27/20  Yes Margarette Canada, NP  promethazine-dextromethorphan (PROMETHAZINE-DM) 6.25-15 MG/5ML syrup Take 5 mLs by mouth 4 (four) times  daily as needed. 10/27/20  Yes Margarette Canada, NP  cetirizine (ZYRTEC) 5 MG tablet Take 5 mg by mouth daily.    [provider]    Family History Family History  Problem Relation Age of Onset   Lung cancer Maternal Grandfather 70       smoker   Heart attack Maternal Grandfather    Prostate cancer Neg Hx    Colon cancer Neg Hx     Social History Social History   Tobacco Use   Smoking status: Former   Smokeless tobacco: Former    Types: Chew   Tobacco comments:    chewing tobacco for 1 year  Substance Use Topics   Alcohol use: Yes    Alcohol/week: 1.0 - 2.0 standard drink    Types: 1 - 2 Standard drinks or equivalent per week   Drug use: Not Currently    Types: Marijuana     Allergies   Seasonal ic [cholestatin]   Review of Systems Review of Systems  Constitutional:  Negative for activity change, appetite change and fever.  HENT:  Positive for congestion, ear pain, rhinorrhea, sneezing and sore throat.   Respiratory:  Positive for cough. Negative for shortness of breath and wheezing.   Gastrointestinal:  Negative for diarrhea, nausea and vomiting.  Skin:  Negative for rash.  Neurological:  Positive for headaches.  Hematological: Negative.   Psychiatric/Behavioral: Negative.      Physical Exam Triage Vital Signs ED Triage Vitals  Enc Vitals Group  BP 10/27/20 1323 (!) 118/92     Pulse Rate 10/27/20 1323 64     Resp 10/27/20 1323 18     Temp 10/27/20 1323 98 F (36.7 C)     Temp Source 10/27/20 1323 Oral     SpO2 10/27/20 1323 100 %     Weight 10/27/20 1322 170 lb (77.1 kg)     Height 10/27/20 1322 5' 10.5" (1.791 m)     Head Circumference --      Peak Flow --      Pain Score 10/27/20 1322 0     Pain Loc --      Pain Edu? --      Excl. in [place]? --    No data found.  Updated Vital Signs BP (!) 118/92 (BP Location: Left Arm)   Pulse 64   Temp 98 F (36.7 C) (Oral)   Resp 18   Ht 5' 10.5" (1.791 m)   Wt 170 lb (77.1 kg)   SpO2 100%   BMI  24.05 kg/m   Visual Acuity Right Eye Distance:   Left Eye Distance:   Bilateral Distance:    Right Eye Near:   Left Eye Near:    Bilateral Near:     Physical Exam Vitals and nursing note reviewed.  Constitutional:      General: He is not in acute distress.    Appearance: Normal appearance. He is normal weight. He is not ill-appearing.  HENT:     Head: Normocephalic and atraumatic.     Right Ear: Tympanic membrane, ear canal and external ear normal. There is no impacted cerumen.     Left Ear: Tympanic membrane, ear canal and external ear normal. There is no impacted cerumen.     Nose: Congestion and rhinorrhea present.     Mouth/Throat:     Mouth: Mucous membranes are moist.     Pharynx: Oropharynx is clear. Posterior oropharyngeal erythema present.  Cardiovascular:     Rate and Rhythm: Normal rate and regular rhythm.     Pulses: Normal pulses.     Heart sounds: Normal heart sounds. No murmur heard.   No gallop.  Pulmonary:     Effort: Pulmonary effort is normal.     Breath sounds: Normal breath sounds. No wheezing, rhonchi or rales.  Musculoskeletal:     Cervical back: Normal range of motion and neck supple.  Lymphadenopathy:     Cervical: No cervical adenopathy.  Skin:    General: Skin is warm and dry.     Capillary Refill: Capillary refill takes less than 2 seconds.     Findings: No erythema or rash.  Neurological:     General: No focal deficit present.     Mental Status: He is alert and oriented to person, place, and time.  Psychiatric:        Mood and Affect: Mood normal.        Behavior: Behavior normal.        Thought Content: Thought content normal.        Judgment: Judgment normal.     UC Treatments / Results  Labs (all labs ordered are listed, but only abnormal results are displayed) Labs Reviewed - No data to display  EKG   Radiology No results found.  Procedures Procedures (including critical care time)  Medications Ordered in  UC Medications - No data to display  Initial Impression / Assessment and Plan / UC Course  I have reviewed the triage vital signs and  the nursing notes.  Pertinent labs & imaging results that were available during my care of the patient were reviewed by me and considered in my medical decision making (see chart for details).  Patient is a nontoxic-appearing 40 year old male here for evaluation of respiratory complaints that have been going on somewhere between 6 days and 2 and half weeks.  The symptoms consist of headache, sinus pressure with a runny nose for clear nasal discharge, sneezing, earache, hoarseness, sore throat, cough that is mostly present in the morning and improves at the day.  No fever, shortness breath or wheezing, or GI complaints.  There is a strong odor of kerosene on the patient in the exam room.  He denies use of kerosene heat or having a diesel pickup truck.  He does work at [employer] but he states he does not have a contact with the boring operation.  He states that the smells like kerosene.  Patient's physical exam reveals pearly gray tympanic membranes bilaterally with normal light reflex and clear external auditory canals.  Nasal mucosa is erythematous and edematous.  There is scant clear to yellow nasal discharge in both nares.  Oropharyngeal exam reveals mild posterior oropharyngeal erythema with postnasal drip.  No cervical lymphadenopathy appreciated on exam.  Cardiopulmonary exam reveals clear lung sounds in all fields.  Patient exam is consistent with an upper respiratory infection.  Possibly viral although with the undetermined length of time there may be an underlying bacterial process.  Patient's symptoms may also be a result of environmental irritation from his place of employment.  We will do a trial of Augmentin and provide Atrovent nasal spray to help with the nasal congestion and postnasal drip.  Promethazine DM cough syrup provided for nighttime and patient  encouraged to return for new or worsening symptoms.   Final Clinical Impressions(s) / UC Diagnoses   Final diagnoses:  Upper respiratory tract infection, unspecified type  Acute cough     Discharge Instructions      The Augmentin twice daily with food for 10 days for treatment of your upper respiratory infection.  Perform sinus irrigation 2-3 times a day with a NeilMed sinus rinse kit and distilled water.  Do not use tap water.  You can use plain over-the-counter Mucinex every 6 hours to break up the stickiness of the mucus so your body can clear it.  Increase your oral fluid intake to thin out your mucus so that is also able for your body to clear more easily.  Take an over-the-counter probiotic, such as Culturelle-align-activia, 1 hour after each dose of antibiotic to prevent diarrhea.  Use the Atrovent nasal spray, 2 squirts in each nostril every 6 hours, as needed for runny nose and postnasal drip.  Use the Promethazine DM cough syrup at bedtime for cough and congestion.  It will make you drowsy so do not take it during the day.  Return for reevaluation or see your primary care provider for any new or worsening symptoms.   If you develop any new or worsening symptoms return for reevaluation or see your primary care provider.      ED Prescriptions     Medication Sig Dispense Auth. Provider   amoxicillin-clavulanate (AUGMENTIN) 875-125 MG tablet Take 1 tablet by mouth every 12 (twelve) hours for 10 days. 20 tablet Margarette Canada, NP   ipratropium (ATROVENT) 0.06 % nasal spray Place 2 sprays into both nostrils 4 (four) times daily. 15 mL Margarette Canada, NP   promethazine-dextromethorphan (PROMETHAZINE-DM) 6.25-15 MG/5ML  syrup Take 5 mLs by mouth 4 (four) times daily as needed. 118 mL Margarette Canada, NP      PDMP not reviewed this encounter.   Margarette Canada, NP 10/27/20 1400

## 2020-10-27 NOTE — ED Triage Notes (Signed)
Pt c/o possible sinus infection, pt has cough, headache, sneezing, earache and scratchy voice. Pt states symptoms increasing since last Wednesday. Pt took at-home COVID test Sunday and it was negative. Pt denies f/n/v/d or other symptoms. Pt states he had similar symptoms about 2 weeks about but thought they had resolved.

## 2020-11-22 ENCOUNTER — Encounter: Payer: Self-pay | Admitting: Family Medicine

## 2020-12-01 ENCOUNTER — Ambulatory Visit: Payer: Commercial Managed Care - PPO | Admitting: Family Medicine

## 2020-12-01 ENCOUNTER — Encounter: Payer: Self-pay | Admitting: Family Medicine

## 2020-12-01 ENCOUNTER — Other Ambulatory Visit: Payer: Self-pay

## 2020-12-01 VITALS — BP 115/71 | HR 64 | Ht 71.0 in | Wt 173.4 lb

## 2020-12-01 DIAGNOSIS — K4090 Unilateral inguinal hernia, without obstruction or gangrene, not specified as recurrent: Secondary | ICD-10-CM

## 2020-12-01 NOTE — Patient Instructions (Addendum)
Thank you for coming to the office today.  You most likely have an Right   This is caused by a weakness in your abdominal or groin muscles, and is caused by bowel or fatty tissue pushing through this weak spot causing pain and bulging.  Recommend a "Hernia Truss", try to wear this regularly (do not need to wear to bed if pain is improved), until it feels better and then only with activities - If it is not improving then you may need to wear it every day, especially if you cannot have a surgery to fix the hernia   You can find a Truss OTC at [facility] or other pharmacy   Try to find positions that give you most relief, likely laying down with head lower than body will allow the hernia bulging to go back into place and feel better.   May take Tylenol as needed. Recommend to start taking Tylenol Extra Strength 500mg  tabs - take 1 to 2 tabs per dose (max 1000mg ) every 6-8 hours for pain (take regularly, don't skip a dose for next 7 days), max 24 hour daily dose is 6 tablets or 3000mg . In the future you can repeat the same everyday Tylenol course for 1-2 weeks at a time.  Advil 400-600mg  with meal 3 times a day as needed.    Can try topical ice packs or muscle rub if burning nerve sensation.  --------------------  For Constipation (less frequent bowel movement that can be hard dry or involve straining).  Recommend trying OTC Miralax 17g = 1 capful in large glass water once daily for now, try several days to see if working, goal is soft stool or BM 1-2 times daily, if too loose then reduce dose or try every other day. If not effective may need to increase it to 2 doses at once in AM or may do 1 in morning and 1 in afternoon/evening  - This medicine is very safe and can be used often without any problem and will not make you dehydrated. It is good for use on AS NEEDED BASIS or even MAINTENANCE therapy for longer term for several days to weeks at a time to help regulate bowel movements  Other more  natural remedies or preventative treatment: - Increase hydration with water - Increase fiber in diet (high fiber foods = vegetables, leafy greens, oats/grains) - May take OTC Fiber supplement (metamucil powder or pill/gummy) - May try OTC Probiotic ------------------------------------------------------------     If significant worsening pain or you get bulging that does NOT go down or go away and CANNOT push back in, or nausea, vomiting, then it is very important to go directly to hospital ED for more immediate evaluation, as this can be a life threatening surgical emergency   Referral sent today - stay tuned on apt  [facility] Address: [address], [place], [facility] [address] Phone: [phone]    Please schedule a Follow-up Appointment to: Return if symptoms worsen or fail to improve.  If you have any other questions or concerns, please feel free to call the office or send a message through MyChart. You may also schedule an earlier appointment if necessary.  Additionally, you may be receiving a survey about your experience at our office within a few days to 1 week by e-mail or mail. We value your feedback.  [place], DO [facility], [phone]

## 2020-12-01 NOTE — Progress Notes (Signed)
Subjective:    Patient ID: Reginald Yoder, male    DOB: 01-01-1981, 40 y.o.   MRN: [id number]  Reginald Yoder is a 41 y.o. male presenting on 12/01/2020 for Groin Pain and Groin Swelling   HPI  Right Groin Pain / Swelling / Suspected hernia Reports first onset symptoms about 1-2 weeks ago, without identified acute onset injury or moment of onset symptoms, he had some mild R groin pain for few days, he was questioning if it was a hip flexor muscle or radiating from his back, he said would notice while sitting at work. Then visually he noticed the swollen bulging area at R groin crease. He has then observed it closer and has noticed swelling coming and going more episodic. He says pain can be episodic. - He says can even have symptoms with prolonged standing. - He reports he had BM recently that seemed to provide some relief. - He has restarted exercise regimen and some weight lifting, but lately not doing this as much    Depression screen [facility] 2/9 12/01/2020 08/27/2020 07/03/2019  Decreased Interest 0 0 0  Down, Depressed, Hopeless 0 0 0  PHQ - 2 Score 0 0 0  Altered sleeping 0 0 -  Tired, decreased energy 0 0 -  Change in appetite 0 0 -  Feeling bad or failure about yourself  0 0 -  Trouble concentrating 0 0 -  Moving slowly or fidgety/restless 0 0 -  Suicidal thoughts 0 0 -  PHQ-9 Score 0 0 -  Difficult doing work/chores Not difficult at all Not difficult at all -    Social History   Tobacco Use   Smoking status: Former   Smokeless tobacco: Former    Types: Chew   Tobacco comments:    chewing tobacco for 1 year  Substance Use Topics   Alcohol use: Yes    Alcohol/week: 1.0 - 2.0 standard drink    Types: 1 - 2 Standard drinks or equivalent per week   Drug use: Not Currently    Types: Marijuana    Review of Systems Per HPI unless specifically indicated above     Objective:    BP 115/71   Pulse 64   Ht 5' 11"  (1.803 m)   Wt 173 lb 6.4 oz (78.7 kg)   SpO2 100%    BMI 24.18 kg/m   Wt Readings from Last 3 Encounters:  12/01/20 173 lb 6.4 oz (78.7 kg)  10/27/20 170 lb (77.1 kg)  08/27/20 173 lb 9.6 oz (78.7 kg)    Physical Exam Results for orders placed or performed in visit on 08/27/20  COMPLETE METABOLIC PANEL WITH GFR  Result Value Ref Range   Glucose, Bld 86 65 - 99 mg/dL   BUN 19 7 - 25 mg/dL   Creat 1.02 0.60 - 1.26 mg/dL   eGFR 96 > OR = 60 mL/min/1.39m   BUN/Creatinine Ratio NOT APPLICABLE 6 - 22 (calc)   Sodium 137 135 - 146 mmol/L   Potassium 4.4 3.5 - 5.3 mmol/L   Chloride 105 98 - 110 mmol/L   CO2 26 20 - 32 mmol/L   Calcium 9.5 8.6 - 10.3 mg/dL   Total Protein 6.9 6.1 - 8.1 g/dL   Albumin 4.6 3.6 - 5.1 g/dL   Globulin 2.3 1.9 - 3.7 g/dL (calc)   AG Ratio 2.0 1.0 - 2.5 (calc)   Total Bilirubin 0.6 0.2 - 1.2 mg/dL   Alkaline phosphatase (APISO) 58 36 - 130 U/L  AST 17 10 - 40 U/L   ALT 10 9 - 46 U/L  Lipid panel  Result Value Ref Range   Cholesterol 184 <200 mg/dL   HDL 48 > OR = 40 mg/dL   Triglycerides 64 <150 mg/dL   LDL Cholesterol (Calc) 120 (H) mg/dL (calc)   Total CHOL/HDL Ratio 3.8 <5.0 (calc)   Non-HDL Cholesterol (Calc) 136 (H) <130 mg/dL (calc)  CBC with Differential/Platelet  Result Value Ref Range   WBC 5.1 3.8 - 10.8 Thousand/uL   RBC 4.69 4.20 - 5.80 Million/uL   Hemoglobin 14.7 13.2 - 17.1 g/dL   HCT 42.9 38.5 - 50.0 %   MCV 91.5 80.0 - 100.0 fL   MCH 31.3 27.0 - 33.0 pg   MCHC 34.3 32.0 - 36.0 g/dL   RDW 12.4 11.0 - 15.0 %   Platelets 250 140 - 400 Thousand/uL   MPV 10.6 7.5 - 12.5 fL   Neutro Abs 2,836 1,500 - 7,800 cells/uL   Lymphs Abs 1,535 850 - 3,900 cells/uL   Absolute Monocytes 479 200 - 950 cells/uL   Eosinophils Absolute 209 15 - 500 cells/uL   Basophils Absolute 41 0 - 200 cells/uL   Neutrophils Relative % 55.6 %   Total Lymphocyte 30.1 %   Monocytes Relative 9.4 %   Eosinophils Relative 4.1 %   Basophils Relative 0.8 %      Assessment & Plan:   Problem List Items Addressed  This Visit   None Visit Diagnoses     Right inguinal hernia    -  Primary   Relevant Orders   Ambulatory referral to General Surgery       Consistent with acute new issue, Right inguinal hernia - Seems to be new problem reducible without obvious significant herniation today but has significant bulging fullness asymmetry to left does not extend into scrotum. - No acute red flag symptoms (without nausea vomiting, bowel obstruction, systemic illness, uncontrolled pain) - No prior hernia before  Plan: 1. Reassurance given with mild reducible intermittent hernia vs difficult to confirm even if actual hernia present, HOWEVER has notable symptoms of pain that are impacting him with even mild activities  Referral to General Surgery for further evaluation of suspected R inguinal hernia, and may consider imaging in future if indicated.  2. Recommend conservative therapy limit provoking activities and avoid heavy lifting, try hernia truss support, topical ice packs as needed, OTC Tylenol NSAID PRN  3. Strict return criteria and when to go to hospital ED for more acute evaluation if any significant worsening, constant pain, systemic symptoms, or potential incarceration bulge that does not reduce  4. Follow-up as needed    Orders Placed This Encounter  Procedures   Ambulatory referral to General Surgery    Referral Priority:   Routine    Referral Type:   Surgical    Referral Reason:   Specialty Services Required    Requested Specialty:   General Surgery    Number of Visits Requested:   1     No orders of the defined types were placed in this encounter.     Follow up plan: Return if symptoms worsen or fail to improve.  Nobie Putnam, [facility] 12/01/2020, 1:39 PM

## 2020-12-15 ENCOUNTER — Ambulatory Visit: Payer: Commercial Managed Care - PPO | Admitting: Surgery

## 2020-12-15 ENCOUNTER — Ambulatory Visit: Payer: Self-pay | Admitting: Surgery

## 2020-12-15 ENCOUNTER — Other Ambulatory Visit: Payer: Self-pay

## 2020-12-15 ENCOUNTER — Encounter: Payer: Self-pay | Admitting: Surgery

## 2020-12-15 VITALS — BP 122/85 | HR 73 | Temp 98.0°F | Ht 70.5 in | Wt 175.2 lb

## 2020-12-15 DIAGNOSIS — K4090 Unilateral inguinal hernia, without obstruction or gangrene, not specified as recurrent: Secondary | ICD-10-CM | POA: Diagnosis not present

## 2020-12-15 NOTE — Patient Instructions (Addendum)
Our surgery scheduler Barbara will call you within 24-48 hours to get you scheduled. If you have not heard from her after 48 hours, please call our office. You will not need to get Covid tested before surgery and have the blue sheet available when she calls to write down important information.  If you have any concerns or questions, please feel free to call our office.   Inguinal Hernia, Adult An inguinal hernia is when fat or your intestines push through a weak spot in a muscle where your leg meets your lower belly (groin). This causes a bulge. This kind of hernia could also be: In your scrotum, if you are male. In folds of skin around your vagina, if you are male. There are three types of inguinal hernias: Hernias that can be pushed back into the belly (are reducible). This type rarely causes pain. Hernias that cannot be pushed back into the belly (are incarcerated). Hernias that cannot be pushed back into the belly and lose their blood supply (are strangulated). This type needs emergency surgery. What are the causes? This condition is caused by having a weak spot in the muscles or tissues in your groin. This develops over time. The hernia may poke through the weak spot when you strain your lower belly muscles all of a sudden, such as when you: Lift a heavy object. Strain to poop (have a bowel movement). Trouble pooping (constipation) can lead to straining. Cough. What increases the risk? This condition is more likely to develop in: Males. Pregnant females. People who: Are overweight. Work in jobs that require long periods of standing or heavy lifting. Have had an inguinal hernia before. Smoke or have lung disease. These factors can lead to long-term (chronic) coughing. What are the signs or symptoms? Symptoms may depend on the size of the hernia. Often, a small hernia has no symptoms. Symptoms of a larger hernia may include: A bulge in the groin area. This is easier to see when  standing. You might not be able to see it when you are lying down. Pain or burning in the groin. This may get worse when you lift, strain, or cough. A dull ache or a feeling of pressure in the groin. An abnormal bulge in the scrotum, in males. Symptoms of a strangulated inguinal hernia may include: A bulge in your groin that is very painful and tender to the touch. A bulge that turns red or purple. Fever, feeling like you may vomit (nausea), and vomiting. Not being able to poop or to pass gas. How is this treated? Treatment depends on the size of your hernia and whether you have symptoms. If you do not have symptoms, your doctor may have you watch your hernia carefully and have you come in for follow-up visits. If your hernia is large or if you have symptoms, you may need surgery to repair the hernia. Follow these instructions at home: Lifestyle Avoid lifting heavy objects. Avoid standing for long amounts of time. Do not smoke or use any products that contain nicotine or tobacco. If you need help quitting, ask your doctor. Stay at a healthy weight. Prevent trouble pooping You may need to take these actions to prevent or treat trouble pooping: Drink enough fluid to keep your pee (urine) pale yellow. Take over-the-counter or prescription medicines. Eat foods that are high in fiber. These include beans, whole grains, and fresh fruits and vegetables. Limit foods that are high in fat and sugar. These include fried or sweet foods. General   instructions You may try to push your hernia back in place by very gently pressing on it when you are lying down. Do not try to push the bulge back in if it will not go in easily. Watch your hernia for any changes in shape, size, or color. Tell your doctor if you see any changes. Take over-the-counter and prescription medicines only as told by your doctor. Keep all follow-up visits. Contact a doctor if: You have a fever or chills. You have new  symptoms. Your symptoms get worse. Get help right away if: You have pain in your groin that gets worse all of a sudden. You have a bulge in your groin that: Gets bigger all of a sudden, and it does not get smaller after that. Turns red or purple. Is painful when you touch it. You are a male, and you have: Sudden pain in your scrotum. A sudden change in the size of your scrotum. You cannot push the hernia back in place by very gently pressing on it when you are lying down. You feel like you may vomit, and that feeling does not go away. You keep vomiting. You have a fast heartbeat. You cannot poop or pass gas. These symptoms may be an emergency. Get help right away. Call your local emergency services (911 in the [place] Do not wait to see if the symptoms will go away. Do not drive yourself to the hospital. Summary An inguinal hernia is when fat or your intestines push through a weak spot in a muscle where your leg meets your lower belly (groin). This causes a bulge. If you do not have symptoms, you may not need treatment. If you have symptoms or a large hernia, you may need surgery. Avoid lifting heavy objects. Also, avoid standing for long amounts of time. Do not try to push the bulge back in if it will not go in easily. This information is not intended to replace advice given to you by your health care provider. Make sure you discuss any questions you have with your health care provider. Document Revised: 09/10/2019 Document Reviewed: 09/10/2019 Elsevier Patient Education  2022 Elsevier Inc.  

## 2020-12-15 NOTE — Progress Notes (Addendum)
Patient ID: Reginald Yoder, male   DOB: Oct 21, 1980, 40 y.o.   MRN: [id number]  Chief Complaint: Right inguinal hernia  History of Present Illness Reginald Yoder is a 40 y.o. male with a progressive right groin discomfort/pain with associated swelling over the last month.  The discomfort seems to be worsened by prolonged standing.  The bulge becomes more prominent at that time as well.  Diminishes spontaneously with supine positioning.  He denies any history of nausea, vomiting, fevers or chills.  Denies any issues with voiding or bowel movements.  No prior imaging.  No pain on the left groin, no prior abdominal surgery.  Past Medical History Past Medical History:  Diagnosis Date   Allergy    GERD (gastroesophageal reflux disease)       Past Surgical History:  Procedure Laterality Date   TONSILLECTOMY Bilateral 1987   VASECTOMY  2017   WISDOM TOOTH EXTRACTION Bilateral 2002    Allergies  Allergen Reactions   Seasonal Ic [Cholestatin]     Current Outpatient Medications  Medication Sig Dispense Refill   cetirizine (ZYRTEC) 5 MG tablet Take 5 mg by mouth daily.     esomeprazole (NEXIUM) 40 MG capsule Take 1 capsule (40 mg total) by mouth daily before breakfast. 90 capsule 3   No current facility-administered medications for this visit.    Family History Family History  Problem Relation Age of Onset   Lung cancer Maternal Grandfather 34       smoker   Heart attack Maternal Grandfather    Prostate cancer Neg Hx    Colon cancer Neg Hx       Social History Social History   Tobacco Use   Smoking status: Former   Smokeless tobacco: Former    Types: Chew   Tobacco comments:    chewing tobacco for 1 year  Substance Use Topics   Alcohol use: Yes    Alcohol/week: 1.0 - 2.0 standard drink    Types: 1 - 2 Standard drinks or equivalent per week   Drug use: Not Currently    Types: Marijuana        Review of Systems  Constitutional: Negative.   HENT: Negative.     Eyes: Negative.   Respiratory: Negative.    Cardiovascular: Negative.   Gastrointestinal:  Positive for heartburn.  Genitourinary: Negative.   Skin: Negative.   Neurological: Negative.   Psychiatric/Behavioral: Negative.       Physical Exam Blood pressure 122/85, pulse 73, temperature 98 F (36.7 C), temperature source Oral, height 5' 10.5" (1.791 m), weight 175 lb 3.2 oz (79.5 kg), SpO2 99 %. Last Weight  Most recent update: 12/15/2020  2:38 PM    Weight  79.5 kg (175 lb 3.2 oz)             CONSTITUTIONAL: Well developed, and nourished, appropriately responsive and aware without distress.   EYES: Sclera non-icteric.   EARS, NOSE, MOUTH AND THROAT: Mask worn.   Hearing is intact to voice.  NECK: Trachea is midline, and there is no jugular venous distension.  LYMPH NODES:  Lymph nodes in the neck are not enlarged. RESPIRATORY:  Lungs are clear, and breath sounds are equal bilaterally. Normal respiratory effort without pathologic use of accessory muscles. CARDIOVASCULAR: Heart is regular in rate and rhythm. GI: The abdomen is soft, nontender, and nondistended. There were no palpable masses. I did not appreciate hepatosplenomegaly. There were normal bowel sounds. GU: Right inguinal hernia, no appreciable left. MUSCULOSKELETAL:  Symmetrical muscle tone appreciated  in all four extremities.    SKIN: Skin turgor is normal. No pathologic skin lesions appreciated.  NEUROLOGIC:  Motor and sensation appear grossly normal.  Cranial nerves are grossly without defect. PSYCH:  Alert and oriented to person, place and time. Affect is appropriate for situation.  Data Reviewed I have personally reviewed what is currently available of the patient's imaging, recent labs and medical records.   Labs:  CBC Latest Ref Rng & Units 08/27/2020 11/29/2017 08/17/2016  WBC 3.8 - 10.8 Thousand/uL 5.1 5.9 5.8  Hemoglobin 13.2 - 17.1 g/dL 43.3 29.5 18.8  Hematocrit 38.5 - 50.0 % 42.9 42 43  Platelets 140 -  400 Thousand/uL 250 240 220   CMP Latest Ref Rng & Units 08/27/2020 11/29/2017 08/17/2016  Glucose 65 - 99 mg/dL 86 - -  BUN 7 - 25 mg/dL 19 21 21   Creatinine 0.60 - 1.26 mg/dL 0.9 1.1  Sodium 4.16 - 146 mmol/L 137 140 139  Potassium 3.5 - 5.3 mmol/L 4.4 4.1 4.8  Chloride 98 - 110 mmol/L 105 106 103  CO2 20 - 32 mmol/L 26 21 26(A)  Calcium 8.6 - 10.3 mg/dL 9.5 9.8 606  Total Protein 6.1 - 8.1 g/dL 6.9 - -  Total Bilirubin 0.2 - 1.2 mg/dL 0.6 - -  Alkaline Phos 25 - 125 - 53 55  AST 10 - 40 U/L 17 26 31   ALT 9 - 46 U/L 10 16 30       Imaging:  Within last 24 hrs: No results found.  Assessment    Right inguinal hernia Patient Active Problem List   Diagnosis Date Noted   Partial retinal tear of right eye without detachment 07/14/2019   Gastroesophageal reflux disease 07/03/2019   Environmental and seasonal allergies 07/03/2019    Plan    Robotic right inguinal hernia repair. I discussed possibility of incarceration, strangulation, enlargement in size over time, and the need for emergency surgery in the face of these.  Also reviewed the techniques of reduction should incarceration occur, and when unsuccessful to present to the ED.  Also discussed that surgery risks include recurrence which can be up to 30% in the case of complex hernias, use of prosthetic materials (mesh) and the increased risk of infection and the possible need for re-operation and removal of mesh, possibility of post-op SBO or ileus, and the risks of general anesthetic including heart attack, stroke, sudden death or some reaction to anesthetic medications. The patient, and those present, appear to understand the risks, any and all questions were answered to the patient's satisfaction.  No guarantees were ever expressed or implied.  Face-to-face time spent with the patient and accompanying care providers(if present) was 30 minutes, with more than 50% of the time spent counseling, educating, and coordinating care of  the patient.    These notes generated with voice recognition software. I apologize for typographical errors.  07/16/2019 M.D., FACS 12/15/2020, 2:39 PM

## 2020-12-15 NOTE — H&P (View-Only) (Signed)
Patient ID: Reginald Yoder, male   DOB: 1980/06/04, 40 y.o.   MRN: [id number]  Chief Complaint: Right inguinal hernia  History of Present Illness Reginald Yoder is a 40 y.o. male with a progressive right groin discomfort/pain with associated swelling over the last month.  The discomfort seems to be worsened by prolonged standing.  The bulge becomes more prominent at that time as well.  Diminishes spontaneously with supine positioning.  He denies any history of nausea, vomiting, fevers or chills.  Denies any issues with voiding or bowel movements.  No prior imaging.  No pain on the left groin, no prior abdominal surgery.  Past Medical History Past Medical History:  Diagnosis Date   Allergy    GERD (gastroesophageal reflux disease)       Past Surgical History:  Procedure Laterality Date   TONSILLECTOMY Bilateral 1987   VASECTOMY  2017   WISDOM TOOTH EXTRACTION Bilateral 2002    Allergies  Allergen Reactions   Seasonal Ic [Cholestatin]     Current Outpatient Medications  Medication Sig Dispense Refill   cetirizine (ZYRTEC) 5 MG tablet Take 5 mg by mouth daily.     esomeprazole (NEXIUM) 40 MG capsule Take 1 capsule (40 mg total) by mouth daily before breakfast. 90 capsule 3   No current facility-administered medications for this visit.    Family History Family History  Problem Relation Age of Onset   Lung cancer Maternal Grandfather 57       smoker   Heart attack Maternal Grandfather    Prostate cancer Neg Hx    Colon cancer Neg Hx       Social History Social History   Tobacco Use   Smoking status: Former   Smokeless tobacco: Former    Types: Chew   Tobacco comments:    chewing tobacco for 1 year  Substance Use Topics   Alcohol use: Yes    Alcohol/week: 1.0 - 2.0 standard drink    Types: 1 - 2 Standard drinks or equivalent per week   Drug use: Not Currently    Types: Marijuana        Review of Systems  Constitutional: Negative.   HENT: Negative.     Eyes: Negative.   Respiratory: Negative.    Cardiovascular: Negative.   Gastrointestinal:  Positive for heartburn.  Genitourinary: Negative.   Skin: Negative.   Neurological: Negative.   Psychiatric/Behavioral: Negative.       Physical Exam Blood pressure 122/85, pulse 73, temperature 98 F (36.7 C), temperature source Oral, height 5' 10.5" (1.791 m), weight 175 lb 3.2 oz (79.5 kg), SpO2 99 %. Last Weight  Most recent update: 12/15/2020  2:38 PM    Weight  79.5 kg (175 lb 3.2 oz)             CONSTITUTIONAL: Well developed, and nourished, appropriately responsive and aware without distress.   EYES: Sclera non-icteric.   EARS, NOSE, MOUTH AND THROAT: Mask worn.   Hearing is intact to voice.  NECK: Trachea is midline, and there is no jugular venous distension.  LYMPH NODES:  Lymph nodes in the neck are not enlarged. RESPIRATORY:  Lungs are clear, and breath sounds are equal bilaterally. Normal respiratory effort without pathologic use of accessory muscles. CARDIOVASCULAR: Heart is regular in rate and rhythm. GI: The abdomen is soft, nontender, and nondistended. There were no palpable masses. I did not appreciate hepatosplenomegaly. There were normal bowel sounds. GU: Right inguinal hernia, no appreciable left. MUSCULOSKELETAL:  Symmetrical muscle tone appreciated  in all four extremities.    SKIN: Skin turgor is normal. No pathologic skin lesions appreciated.  NEUROLOGIC:  Motor and sensation appear grossly normal.  Cranial nerves are grossly without defect. PSYCH:  Alert and oriented to person, place and time. Affect is appropriate for situation.  Data Reviewed I have personally reviewed what is currently available of the patient's imaging, recent labs and medical records.   Labs:  CBC Latest Ref Rng & Units 08/27/2020 11/29/2017 08/17/2016  WBC 3.8 - 10.8 Thousand/uL 5.1 5.9 5.8  Hemoglobin 13.2 - 17.1 g/dL 43.3 29.5 18.8  Hematocrit 38.5 - 50.0 % 42.9 42 43  Platelets 140 -  400 Thousand/uL 250 240 220   CMP Latest Ref Rng & Units 08/27/2020 11/29/2017 08/17/2016  Glucose 65 - 99 mg/dL 86 - -  BUN 7 - 25 mg/dL 19 21 21   Creatinine 0.60 - 1.26 mg/dL 0.9 1.1  Sodium 4.16 - 146 mmol/L 137 140 139  Potassium 3.5 - 5.3 mmol/L 4.4 4.1 4.8  Chloride 98 - 110 mmol/L 105 106 103  CO2 20 - 32 mmol/L 26 21 26(A)  Calcium 8.6 - 10.3 mg/dL 9.5 9.8 606  Total Protein 6.1 - 8.1 g/dL 6.9 - -  Total Bilirubin 0.2 - 1.2 mg/dL 0.6 - -  Alkaline Phos 25 - 125 - 53 55  AST 10 - 40 U/L 17 26 31   ALT 9 - 46 U/L 10 16 30       Imaging:  Within last 24 hrs: No results found.  Assessment    Right inguinal hernia Patient Active Problem List   Diagnosis Date Noted   Partial retinal tear of right eye without detachment 07/14/2019   Gastroesophageal reflux disease 07/03/2019   Environmental and seasonal allergies 07/03/2019    Plan    Robotic right inguinal hernia repair. I discussed possibility of incarceration, strangulation, enlargement in size over time, and the need for emergency surgery in the face of these.  Also reviewed the techniques of reduction should incarceration occur, and when unsuccessful to present to the ED.  Also discussed that surgery risks include recurrence which can be up to 30% in the case of complex hernias, use of prosthetic materials (mesh) and the increased risk of infection and the possible need for re-operation and removal of mesh, possibility of post-op SBO or ileus, and the risks of general anesthetic including heart attack, stroke, sudden death or some reaction to anesthetic medications. The patient, and those present, appear to understand the risks, any and all questions were answered to the patient's satisfaction.  No guarantees were ever expressed or implied.  Face-to-face time spent with the patient and accompanying care providers(if present) was 30 minutes, with more than 50% of the time spent counseling, educating, and coordinating care of  the patient.    These notes generated with voice recognition software. I apologize for typographical errors.  07/16/2019 M.D., FACS 12/15/2020, 2:39 PM

## 2020-12-16 ENCOUNTER — Telehealth: Payer: Self-pay | Admitting: Surgery

## 2020-12-16 NOTE — Telephone Encounter (Signed)
Patient has been advised of Pre-Admission date/time, COVID Testing date and Surgery date.  Surgery Date: 01/01/21 Preadmission Testing Date: 12/28/20 (phone 8:00 am -1:00 pm ) Covid Testing Date: Not needed.     Patient has been made aware to call [phone], between 1-3:00pm the day before surgery, to find out what time to arrive for surgery.

## 2020-12-28 ENCOUNTER — Encounter
Admission: RE | Admit: 2020-12-28 | Discharge: 2020-12-28 | Disposition: A | Discharge status: Home / Self Care | Payer: Commercial Managed Care - PPO | Source: Ambulatory Visit | Attending: Surgery | Admitting: Surgery

## 2020-12-28 ENCOUNTER — Other Ambulatory Visit: Payer: Self-pay

## 2020-12-28 NOTE — Patient Instructions (Signed)
Your procedure is scheduled on:01-01-21 Friday Report to the Registration Desk on the 1st floor of the Medical Mall.Then proceed to the 2nd floor Surgery Desk in the Medical Mall To find out your arrival time, please call [phone] between 1PM - 3PM on:12-31-20 Thursday  REMEMBER: Instructions that are not followed completely may result in serious medical risk, up to and including death; or upon the discretion of your surgeon and anesthesiologist your surgery may need to be rescheduled.  Do not eat food after midnight the night before surgery.  No gum chewing, lozengers or hard candies.  You may however, drink CLEAR liquids up to 2 hours before you are scheduled to arrive for your surgery. Do not drink anything within 2 hours of your scheduled arrival time.  Clear liquids include: - water  - apple juice without pulp - gatorade (not RED, PURPLE, OR BLUE) - black coffee or tea (Do NOT add milk or creamers to the coffee or tea) Do NOT drink anything that is not on this list.  TAKE THESE MEDICATIONS THE MORNING OF SURGERY WITH A SIP OF WATER: -cetirizine (ZYRTEC) 10 MG tablet -esomeprazole (NEXIUM) 40 MG capsule-take one the night before and one on the morning of surgery - helps to prevent nausea after surgery.  One week prior to surgery: Stop Anti-inflammatories (NSAIDS) such as Advil, Aleve, Ibuprofen, Motrin, Naproxen, Naprosyn and Aspirin based products such as Excedrin, Goodys Powder, BC Powder.You may however, continue to take Tylenol if needed for pain up until the day of surgery.  Stop ANY OVER THE COUNTER supplements/vitamins NOW (12-28-20) until after surgery.  No Alcohol for 24 hours before or after surgery.  No Smoking including e-cigarettes for 24 hours prior to surgery.  No chewable tobacco products for at least 6 hours prior to surgery.  No nicotine patches on the day of surgery.  Do not use any "recreational" drugs for at least a week prior to your surgery.  Please be  advised that the combination of cocaine and anesthesia may have negative outcomes, up to and including death. If you test positive for cocaine, your surgery will be cancelled.  On the morning of surgery brush your teeth with toothpaste and water, you may rinse your mouth with mouthwash if you wish. Do not swallow any toothpaste or mouthwash.  Use CHG Soap as directed on instruction sheet.  Do not wear jewelry, make-up, hairpins, clips or nail polish.  Do not wear lotions, powders, or perfumes.   Do not shave body from the neck down 48 hours prior to surgery just in case you cut yourself which could leave a site for infection.  Also, freshly shaved skin may become irritated if using the CHG soap.  Contact lenses, hearing aids and dentures may not be worn into surgery.  Do not bring valuables to the hospital. [facility] is not responsible for any missing/lost belongings or valuables.   Notify your doctor if there is any change in your medical condition (cold, fever, infection).  Wear comfortable clothing (specific to your surgery type) to the hospital.  After surgery, you can help prevent lung complications by doing breathing exercises.  Take deep breaths and cough every 1-2 hours. Your doctor may order a device called an Incentive Spirometer to help you take deep breaths. When coughing or sneezing, hold a pillow firmly against your incision with both hands. This is called "splinting." Doing this helps protect your incision. It also decreases belly discomfort.  If you are being admitted to the  hospital overnight, leave your suitcase in the car. After surgery it may be brought to your room.  If you are being discharged the day of surgery, you will not be allowed to drive home. You will need a responsible adult (18 years or older) to drive you home and stay with you that night.   If you are taking public transportation, you will need to have a responsible adult (18 years or older) with  you. Please confirm with your physician that it is acceptable to use public transportation.   Please call the Pre-admissions Testing Dept. at [phone] if you have any questions about these instructions.  Surgery Visitation Policy:  Patients undergoing a surgery or procedure may have one family member or support person with them as long as that person is not COVID-19 positive or experiencing its symptoms.  That person may remain in the waiting area during the procedure and may rotate out with other people.  Inpatient Visitation:    Visiting hours are 7 a.m. to 8 p.m. Up to two visitors ages 16+ are allowed at one time in a patient room. The visitors may rotate out with other people during the day. Visitors must check out when they leave, or other visitors will not be allowed. One designated support person may remain overnight. The visitor must pass COVID-19 screenings, use hand sanitizer when entering and exiting the patient's room and wear a mask at all times, including in the patient's room. Patients must also wear a mask when staff or their visitor are in the room. Masking is required regardless of vaccination status.

## 2020-12-31 MED ORDER — CEFAZOLIN SODIUM-DEXTROSE 2-4 GM/100ML-% IV SOLN
2.0000 g | INTRAVENOUS | Status: AC
  Administered 2021-01-01: 2 g via INTRAVENOUS

## 2020-12-31 MED ORDER — CELECOXIB 200 MG PO CAPS: 200.0000 mg | ORAL_CAPSULE | ORAL | Status: AC

## 2020-12-31 MED ORDER — ACETAMINOPHEN 500 MG PO TABS: 1000.0000 mg | ORAL_TABLET | ORAL | Status: AC

## 2020-12-31 MED ORDER — BUPIVACAINE LIPOSOME 1.3 % IJ SUSP: 20.0000 mL | Freq: Once | INTRAMUSCULAR | Status: DC

## 2020-12-31 MED ORDER — CHLORHEXIDINE GLUCONATE CLOTH 2 % EX PADS
6.0000 | MEDICATED_PAD | Freq: Once | CUTANEOUS | Status: DC
Start: 2020-12-31 — End: 2021-01-01

## 2020-12-31 MED ORDER — LACTATED RINGERS IV SOLN: INTRAVENOUS | Status: DC

## 2020-12-31 MED ORDER — ORAL CARE MOUTH RINSE: 15.0000 mL | Freq: Once | OROMUCOSAL | Status: AC

## 2020-12-31 MED ORDER — GABAPENTIN 300 MG PO CAPS: 300.0000 mg | ORAL_CAPSULE | ORAL | Status: AC

## 2020-12-31 MED ORDER — CHLORHEXIDINE GLUCONATE CLOTH 2 % EX PADS: 6.0000 | MEDICATED_PAD | Freq: Once | CUTANEOUS | Status: DC

## 2020-12-31 MED ORDER — CHLORHEXIDINE GLUCONATE 0.12 % MT SOLN: 15.0000 mL | Freq: Once | OROMUCOSAL | Status: AC

## 2021-01-01 ENCOUNTER — Encounter: Payer: Self-pay | Admitting: Surgery

## 2021-01-01 ENCOUNTER — Ambulatory Visit: Payer: Commercial Managed Care - PPO | Admitting: Anesthesiology

## 2021-01-01 ENCOUNTER — Ambulatory Visit
Admission: RE | Admit: 2021-01-01 | Discharge: 2021-01-01 | Disposition: A | Discharge status: Home / Self Care | Payer: Commercial Managed Care - PPO | Attending: Surgery | Admitting: Surgery

## 2021-01-01 ENCOUNTER — Encounter
Admission: RE | Disposition: A | Discharge status: Home / Self Care | Payer: Self-pay | Source: Home / Self Care | Attending: Surgery

## 2021-01-01 ENCOUNTER — Other Ambulatory Visit: Payer: Self-pay

## 2021-01-01 DIAGNOSIS — K219 Gastro-esophageal reflux disease without esophagitis: Secondary | ICD-10-CM | POA: Insufficient documentation

## 2021-01-01 DIAGNOSIS — K4090 Unilateral inguinal hernia, without obstruction or gangrene, not specified as recurrent: Secondary | ICD-10-CM | POA: Diagnosis present

## 2021-01-01 DIAGNOSIS — Z87891 Personal history of nicotine dependence: Secondary | ICD-10-CM | POA: Insufficient documentation

## 2021-01-01 SURGERY — HERNIORRHAPHY, INGUINAL, ROBOT-ASSISTED, LAPAROSCOPIC
Anesthesia: General | Site: Abdomen | Laterality: Right

## 2021-01-01 MED ORDER — ONDANSETRON HCL 4 MG/2ML IJ SOLN
INTRAMUSCULAR | Status: AC
  Filled 2021-01-01: qty 2

## 2021-01-01 MED ORDER — CHLORHEXIDINE GLUCONATE 0.12 % MT SOLN
OROMUCOSAL | Status: AC
  Administered 2021-01-01: 15 mL via OROMUCOSAL
  Filled 2021-01-01: qty 15

## 2021-01-01 MED ORDER — MIDAZOLAM HCL 2 MG/2ML IJ SOLN
INTRAMUSCULAR | Status: AC
  Filled 2021-01-01: qty 2

## 2021-01-01 MED ORDER — ACETAMINOPHEN 500 MG PO TABS
ORAL_TABLET | ORAL | Status: AC
  Administered 2021-01-01: 1000 mg via ORAL
  Filled 2021-01-01: qty 2

## 2021-01-01 MED ORDER — CELECOXIB 200 MG PO CAPS
ORAL_CAPSULE | ORAL | Status: AC
  Administered 2021-01-01: 200 mg via ORAL
  Filled 2021-01-01: qty 1

## 2021-01-01 MED ORDER — PROMETHAZINE HCL 25 MG/ML IJ SOLN: 6.2500 mg | INTRAMUSCULAR | Status: DC | PRN

## 2021-01-01 MED ORDER — HYDROCODONE-ACETAMINOPHEN 5-325 MG PO TABS: 1.0000 | ORAL_TABLET | Freq: Four times a day (QID) | ORAL | 0 refills | Status: DC | PRN

## 2021-01-01 MED ORDER — GABAPENTIN 300 MG PO CAPS
ORAL_CAPSULE | ORAL | Status: AC
  Administered 2021-01-01: 300 mg via ORAL
  Filled 2021-01-01: qty 1

## 2021-01-01 MED ORDER — SUGAMMADEX SODIUM 200 MG/2ML IV SOLN
INTRAVENOUS | Status: DC | PRN
  Administered 2021-01-01: 200 mg via INTRAVENOUS

## 2021-01-01 MED ORDER — KETOROLAC TROMETHAMINE 30 MG/ML IJ SOLN
INTRAMUSCULAR | Status: DC | PRN
  Administered 2021-01-01: 15 mg via INTRAVENOUS

## 2021-01-01 MED ORDER — DEXAMETHASONE SODIUM PHOSPHATE 10 MG/ML IJ SOLN
INTRAMUSCULAR | Status: DC | PRN
  Administered 2021-01-01: 5 mg via INTRAVENOUS

## 2021-01-01 MED ORDER — FENTANYL CITRATE (PF) 100 MCG/2ML IJ SOLN
INTRAMUSCULAR | Status: DC | PRN
  Administered 2021-01-01 (×2): 50 ug via INTRAVENOUS

## 2021-01-01 MED ORDER — ROCURONIUM BROMIDE 100 MG/10ML IV SOLN
INTRAVENOUS | Status: DC | PRN
  Administered 2021-01-01: 50 mg via INTRAVENOUS

## 2021-01-01 MED ORDER — MIDAZOLAM HCL 2 MG/2ML IJ SOLN
INTRAMUSCULAR | Status: DC | PRN
Start: 2021-01-01 — End: 2021-01-01
  Administered 2021-01-01 (×2): 1 mg via INTRAVENOUS

## 2021-01-01 MED ORDER — BUPIVACAINE-EPINEPHRINE 0.25% -1:200000 IJ SOLN
INTRAMUSCULAR | Status: DC | PRN
  Administered 2021-01-01: 40 mL via SURGICAL_CAVITY

## 2021-01-01 MED ORDER — BUPIVACAINE-EPINEPHRINE (PF) 0.25% -1:200000 IJ SOLN
INTRAMUSCULAR | Status: AC
  Filled 2021-01-01: qty 30

## 2021-01-01 MED ORDER — FENTANYL CITRATE (PF) 100 MCG/2ML IJ SOLN
25.0000 ug | INTRAMUSCULAR | Status: DC | PRN
  Administered 2021-01-01 (×3): 25 ug via INTRAVENOUS

## 2021-01-01 MED ORDER — LIDOCAINE HCL (CARDIAC) PF 100 MG/5ML IV SOSY
PREFILLED_SYRINGE | INTRAVENOUS | Status: DC | PRN
  Administered 2021-01-01: 100 mg via INTRAVENOUS

## 2021-01-01 MED ORDER — LIDOCAINE HCL (PF) 2 % IJ SOLN
INTRAMUSCULAR | Status: AC
  Filled 2021-01-01: qty 5

## 2021-01-01 MED ORDER — ONDANSETRON HCL 4 MG/2ML IJ SOLN
INTRAMUSCULAR | Status: DC | PRN
  Administered 2021-01-01: 4 mg via INTRAVENOUS

## 2021-01-01 MED ORDER — ROCURONIUM BROMIDE 10 MG/ML (PF) SYRINGE
PREFILLED_SYRINGE | INTRAVENOUS | Status: AC
  Filled 2021-01-01: qty 10

## 2021-01-01 MED ORDER — DEXAMETHASONE SODIUM PHOSPHATE 10 MG/ML IJ SOLN
INTRAMUSCULAR | Status: AC
  Filled 2021-01-01: qty 1

## 2021-01-01 MED ORDER — IBUPROFEN 800 MG PO TABS: 800.0000 mg | ORAL_TABLET | Freq: Three times a day (TID) | ORAL | 0 refills | Status: DC | PRN

## 2021-01-01 MED ORDER — BUPIVACAINE LIPOSOME 1.3 % IJ SUSP
INTRAMUSCULAR | Status: AC
  Filled 2021-01-01: qty 20

## 2021-01-01 MED ORDER — KETOROLAC TROMETHAMINE 30 MG/ML IJ SOLN
INTRAMUSCULAR | Status: AC
  Filled 2021-01-01: qty 1

## 2021-01-01 MED ORDER — PROPOFOL 10 MG/ML IV BOLUS
INTRAVENOUS | Status: AC
  Filled 2021-01-01: qty 20

## 2021-01-01 MED ORDER — FENTANYL CITRATE (PF) 100 MCG/2ML IJ SOLN
INTRAMUSCULAR | Status: AC
  Filled 2021-01-01: qty 2

## 2021-01-01 MED ORDER — PROPOFOL 10 MG/ML IV BOLUS
INTRAVENOUS | Status: DC | PRN
  Administered 2021-01-01: 150 mg via INTRAVENOUS

## 2021-01-01 MED ORDER — CEFAZOLIN SODIUM-DEXTROSE 2-4 GM/100ML-% IV SOLN
INTRAVENOUS | Status: AC
  Filled 2021-01-01: qty 100

## 2021-01-01 MED ORDER — 0.9 % SODIUM CHLORIDE (POUR BTL) OPTIME
TOPICAL | Status: DC | PRN
  Administered 2021-01-01: 50 mL

## 2021-01-01 MED ORDER — FENTANYL CITRATE (PF) 100 MCG/2ML IJ SOLN
INTRAMUSCULAR | Status: AC
  Administered 2021-01-01: 25 ug via INTRAVENOUS
  Filled 2021-01-01: qty 2

## 2021-01-01 SURGICAL SUPPLY — 48 items
ADH SKN CLS APL DERMABOND .7 (GAUZE/BANDAGES/DRESSINGS) ×1
APL PRP STRL LF DISP 70% ISPRP (MISCELLANEOUS) ×1
BLADE CLIPPER SURG (BLADE) ×2 IMPLANT
CANNULA CAP OBTURATR AIRSEAL 8 (CAP) ×2 IMPLANT
CHLORAPREP W/TINT 26 (MISCELLANEOUS) ×2 IMPLANT
COVER TIP SHEARS 8 DVNC (MISCELLANEOUS) ×1 IMPLANT
COVER TIP SHEARS 8MM DA VINCI (MISCELLANEOUS) ×1
COVER WAND RF STERILE (DRAPES) ×2 IMPLANT
DEFOGGER SCOPE WARMER CLEARIFY (MISCELLANEOUS) ×2 IMPLANT
DERMABOND ADVANCED (GAUZE/BANDAGES/DRESSINGS) ×1
DERMABOND ADVANCED .7 DNX12 (GAUZE/BANDAGES/DRESSINGS) ×1 IMPLANT
DRAPE 3/4 80X56 (DRAPES) ×2 IMPLANT
DRAPE ARM DVNC X/XI (DISPOSABLE) ×3 IMPLANT
DRAPE COLUMN DVNC XI (DISPOSABLE) ×1 IMPLANT
DRAPE DA VINCI XI ARM (DISPOSABLE) ×3
DRAPE DA VINCI XI COLUMN (DISPOSABLE) ×1
ELECT REM PT RETURN 9FT ADLT (ELECTROSURGICAL) ×2
ELECTRODE REM PT RTRN 9FT ADLT (ELECTROSURGICAL) ×1 IMPLANT
GAUZE 4X4 16PLY ~~LOC~~+RFID DBL (SPONGE) ×2 IMPLANT
GLOVE SURG ORTHO LTX SZ7.5 (GLOVE) ×6 IMPLANT
GOWN STRL REUS W/ TWL LRG LVL3 (GOWN DISPOSABLE) ×3 IMPLANT
GOWN STRL REUS W/TWL LRG LVL3 (GOWN DISPOSABLE) ×6
GRASPER SUT TROCAR 14GX15 (MISCELLANEOUS) ×2 IMPLANT
IRRIGATION STRYKERFLOW (MISCELLANEOUS) IMPLANT
IRRIGATOR STRYKERFLOW (MISCELLANEOUS)
IV CATH ANGIO 14GX1.88 NO SAFE (IV SOLUTION) ×2 IMPLANT
IV NS 1000ML (IV SOLUTION)
IV NS 1000ML BAXH (IV SOLUTION) IMPLANT
KIT PINK PAD W/HEAD ARE REST (MISCELLANEOUS) ×2
KIT PINK PAD W/HEAD ARM REST (MISCELLANEOUS) ×1 IMPLANT
LABEL OR SOLS (LABEL) ×2 IMPLANT
MANIFOLD NEPTUNE II (INSTRUMENTS) ×2 IMPLANT
MESH 3DMAX LIGHT 4.1X6.2 RT LR (Mesh General) ×2 IMPLANT
NEEDLE HYPO 22GX1.5 SAFETY (NEEDLE) ×2 IMPLANT
NEEDLE INSUFFLATION 14GA 120MM (NEEDLE) ×2 IMPLANT
PACK LAP CHOLECYSTECTOMY (MISCELLANEOUS) ×2 IMPLANT
SEAL CANN UNIV 5-8 DVNC XI (MISCELLANEOUS) ×2 IMPLANT
SEAL XI 5MM-8MM UNIVERSAL (MISCELLANEOUS) ×2
SET TUBE FILTERED XL AIRSEAL (SET/KITS/TRAYS/PACK) ×2 IMPLANT
SOLUTION ELECTROLUBE (MISCELLANEOUS) ×2 IMPLANT
SUT MNCRL 4-0 (SUTURE) ×2
SUT MNCRL 4-0 27XMFL (SUTURE) ×1
SUT VIC AB 0 CT2 27 (SUTURE) ×2 IMPLANT
SUT VLOC 90 2/L VL 12 GS22 (SUTURE) IMPLANT
SUT VLOC 90 S/L VL9 GS22 (SUTURE) ×2 IMPLANT
SUTURE MNCRL 4-0 27XMF (SUTURE) ×1 IMPLANT
TROCAR Z-THREAD FIOS 11X100 BL (TROCAR) IMPLANT
WATER STERILE IRR 500ML POUR (IV SOLUTION) ×2 IMPLANT

## 2021-01-01 NOTE — Interval H&P Note (Signed)
History and Physical Interval Note:  01/01/2021 7:28 AM  Reginald Yoder  has presented today for surgery, with the diagnosis of right inguinal hernia.  The various methods of treatment have been discussed with the patient and family. After consideration of risks, benefits and other options for treatment, the patient has consented to  Procedure(s): XI ROBOTIC ASSISTED INGUINAL HERNIA (Right) as a surgical intervention.  The patient's history has been reviewed, patient examined, no change in status, stable for surgery.  I have reviewed the patient's chart and labs.  Questions were answered to the patient's satisfaction.    The Right side is marked.  Campbell Lerner

## 2021-01-01 NOTE — Transfer of Care (Signed)
Immediate Anesthesia Transfer of Care Note  Patient: Reginald Yoder  Procedure(s) Performed: XI ROBOTIC ASSISTED INGUINAL HERNIA (Right: Abdomen)  Patient Location: PACU  Anesthesia Type:General  Level of Consciousness: drowsy and patient cooperative  Airway & Oxygen Therapy: Patient Spontanous Breathing and Patient connected to face mask oxygen  Post-op Assessment: Report given to RN and Post -op Vital signs reviewed and stable  Post vital signs: Reviewed and stable  Last Vitals:  Vitals Value Taken Time  BP 120/79 01/01/21 0859  Temp    Pulse 85 01/01/21 0901  Resp 12 01/01/21 0901  SpO2 100 % 01/01/21 0901  Vitals shown include unvalidated device data.  Last Pain:  Vitals:   01/01/21 0619  TempSrc: Temporal  PainSc: 1          Complications: No notable events documented.

## 2021-01-01 NOTE — OR Nursing (Signed)
Per B. Freida Busman, RN, per Dr. Claudine Mouton verbal, patient does not need to void prior to discharge.

## 2021-01-01 NOTE — Anesthesia Preprocedure Evaluation (Signed)
Anesthesia Evaluation  Patient identified by MRN, date of birth, ID band Patient awake    Reviewed: Allergy & Precautions, H&P , NPO status , Patient's Chart, lab work & pertinent test results, reviewed documented beta blocker date and time   Airway Mallampati: I  TM Distance: >3 FB Neck ROM: full    Dental  (+) Dental Advidsory Given   Pulmonary neg pulmonary ROS, former smoker,    Pulmonary exam normal breath sounds clear to auscultation       Cardiovascular Exercise Tolerance: Good negative cardio ROS Normal cardiovascular exam Rhythm:regular Rate:Normal     Neuro/Psych negative neurological ROS  negative psych ROS   GI/Hepatic Neg liver ROS, GERD  ,  Endo/Other  negative endocrine ROS  Renal/GU negative Renal ROS  negative genitourinary   Musculoskeletal   Abdominal   Peds  Hematology negative hematology ROS (+)   Anesthesia Other Findings Past Medical History: No date: Allergy No date: GERD (gastroesophageal reflux disease)   Reproductive/Obstetrics negative OB ROS                             Anesthesia Physical Anesthesia Plan  ASA: 2  Anesthesia Plan: General   Post-op Pain Management:    Induction:   PONV Risk Score and Plan: 2 and Ondansetron, Dexamethasone, Midazolam, Treatment may vary due to age or medical condition and Promethazine  Airway Management Planned: LMA and Oral ETT  Additional Equipment:   Intra-op Plan:   Post-operative Plan: Extubation in OR  Informed Consent: I have reviewed the patients History and Physical, chart, labs and discussed the procedure including the risks, benefits and alternatives for the proposed anesthesia with the patient or authorized representative who has indicated his/her understanding and acceptance.     Dental Advisory Given  Plan Discussed with: Anesthesiologist, CRNA and Surgeon  Anesthesia Plan Comments:          Anesthesia Quick Evaluation

## 2021-01-01 NOTE — Discharge Instructions (Addendum)
AMBULATORY SURGERY  DISCHARGE INSTRUCTIONS   The drugs that you were given will stay in your system until tomorrow so for the next 24 hours you should not:  Drive an automobile Make any legal decisions Drink any alcoholic beverage   You may resume regular meals tomorrow.  Today it is better to start with liquids and gradually work up to solid foods.  You may eat anything you prefer, but it is better to start with liquids, then soup and crackers, and gradually work up to solid foods.   Please notify your doctor immediately if you have any unusual bleeding, trouble breathing, redness and pain at the surgery site, drainage, fever, or pain not relieved by medication.    Additional Instructions:   IF UNABLE TO EMPTY YOUR BLADDER BY 4pm, come to the Emergency Room at the hospital.        Please contact your physician with any problems or Same Day Surgery at [phone], Monday through Friday 6 am to 4 pm, or [place] [facility] number at [phone].

## 2021-01-01 NOTE — Op Note (Signed)
Robotic assisted Laparoscopic Transabdominal right inguinal Hernia Repair with Mesh       Pre-operative Diagnosis: Right inguinal Hernia   Post-operative Diagnosis: Same   Procedure: Robotic assisted Laparoscopic  repair of right inguinal hernia(s)   Surgeon: Campbell Lerner, M.D., FACS   Anesthesia: GETA   Findings: Right indirect inguinal hernia, no evidence of left sided hernia.         Procedure Details  The patient was seen again in the Holding Room. The benefits, complications, treatment options, and expected outcomes were discussed with the patient. The risks of bleeding, infection, recurrence of symptoms, failure to resolve symptoms, recurrence of hernia, ischemic orchitis, chronic pain syndrome or neuroma, were reviewed again. The likelihood of improving the patient's symptoms with return to their baseline status is good.  The patient and/or family concurred with the proposed plan, giving informed consent.  The patient was taken to Operating Room, identified  and the procedure verified as Laparoscopic Inguinal Hernia Repair. Laterality confirmed.  A Time Out was held and the above information confirmed.   Prior to the induction of general anesthesia, antibiotic prophylaxis was administered. VTE prophylaxis was in place. General endotracheal anesthesia was then administered and tolerated well. After the induction, the abdomen was prepped with Chloraprep and draped in the sterile fashion. The patient was positioned in the supine position.   After local infiltration of one-to-one mixture of Exparel with quarter percent Marcaine with epinephrine, stab incision was made left upper quadrant.  On the left at Palmer's point, the Veress needle is passed with sensation of the layers to penetrate the abdominal wall and into the peritoneum.  Saline drop test is confirmed peritoneal placement.  Insufflation is initiated with carbon dioxide to pressures of 15 mmHg. An 8.5 mm port is placed to the  left off of the midline, with blunt tipped trocar.  Pneumoperitoneum maintained w/o HD changes using the AirSeal to pressures of 15 mm Hg with CO2. No evidence of bowel injuries.  Two 8.5 mm ports placed under direct vision in each upper quadrant. The laparoscopy revealed right sided indirect defect(s).   The robot was brought ot the table and docked in the standard fashion, no collision between arms was observed. Instruments were kept under direct view at all times. For right inguinal hernia repair,  I developed a peritoneal flap. The sac(s) were reduced and dissected free from adjacent structures. We preserved the vas and the vessels, and visualized them to their convergence and beyond in the retroperitoneum. Once dissection was completed a large right sided BARD 3D Light mesh was placed and secured at three points with interrupted 0 Vicryl to the pubic tubercle and anteriorly. There was good coverage of the direct, indirect and femoral spaces. Photos taken, throughout procedure. Second look revealed no complications or injuries. The flap was then closed with 2-0 V-lock suture.  Peritoneal closure without defects.  Once assuring that hemostasis was adequate, all needles/sponges removed, and the robot was undocked.  The ports were removed, the abdomen desulflated.  4-0 subcuticular Monocryl was used at all skin edges. Dermabond was placed.  Patient tolerated the procedure well. There were no complications. He was taken to the recovery room in stable condition.           Campbell Lerner, M.D., FACS 01/01/2021, 8:58 AM

## 2021-01-01 NOTE — OR Nursing (Signed)
IV not documented in epic, d/c'd in postop.

## 2021-01-01 NOTE — Anesthesia Procedure Notes (Signed)
Procedure Name: Intubation Date/Time: 01/01/2021 7:47 AM Performed by: Jannet Mantis, CRNA Pre-anesthesia Checklist: Patient identified, Emergency Drugs available, Suction available, Patient being monitored and Timeout performed Patient Re-evaluated:Patient Re-evaluated prior to induction Oxygen Delivery Method: Circle system utilized Preoxygenation: Pre-oxygenation with 100% oxygen Induction Type: IV induction Ventilation: Mask ventilation without difficulty Laryngoscope Size: McGraph and 4 Grade View: Grade I Tube type: Oral Tube size: 7.0 mm Number of attempts: 1 Airway Equipment and Method: Stylet Placement Confirmation: ETT inserted through vocal cords under direct vision Secured at: 21 cm Tube secured with: Tape Dental Injury: Teeth and Oropharynx as per pre-operative assessment

## 2021-01-01 NOTE — Anesthesia Postprocedure Evaluation (Signed)
Anesthesia Post Note  Patient: Reginald Yoder  Procedure(s) Performed: XI ROBOTIC ASSISTED INGUINAL HERNIA REPAIR WITH MESH (Right: Abdomen)  Patient location during evaluation: PACU Anesthesia Type: General Level of consciousness: awake and alert Pain management: pain level controlled Vital Signs Assessment: post-procedure vital signs reviewed and stable Respiratory status: spontaneous breathing, nonlabored ventilation, respiratory function stable and patient connected to nasal cannula oxygen Cardiovascular status: blood pressure returned to baseline and stable Postop Assessment: no apparent nausea or vomiting Anesthetic complications: no   No notable events documented.   Last Vitals:  Vitals:   01/01/21 0930 01/01/21 0949  BP: 118/79 125/80  Pulse: 77 74  Resp: (!) 22 18  Temp:  (!) 36.4 C  SpO2: 100% 100%    Last Pain:  Vitals:   01/01/21 0949  TempSrc: Temporal  PainSc: 3                  Lenard Simmer

## 2021-01-12 ENCOUNTER — Ambulatory Visit (INDEPENDENT_AMBULATORY_CARE_PROVIDER_SITE_OTHER): Payer: Commercial Managed Care - PPO | Admitting: Physician Assistant

## 2021-01-12 ENCOUNTER — Encounter: Payer: Self-pay | Admitting: Physician Assistant

## 2021-01-12 ENCOUNTER — Other Ambulatory Visit: Payer: Self-pay

## 2021-01-12 VITALS — BP 121/84 | HR 75 | Temp 98.0°F | Ht 70.5 in | Wt 169.8 lb

## 2021-01-12 DIAGNOSIS — K4090 Unilateral inguinal hernia, without obstruction or gangrene, not specified as recurrent: Secondary | ICD-10-CM

## 2021-01-12 DIAGNOSIS — Z09 Encounter for follow-up examination after completed treatment for conditions other than malignant neoplasm: Secondary | ICD-10-CM

## 2021-01-12 NOTE — Progress Notes (Signed)
[facility] POST-OP OFFICE VISIT  01/13/2021  HPI: Calden Dorsey is a 40 y.o. male 11 days s/p robotic assisted laparoscopic right inguinal hernia repair with Dr Claudine Mouton   He has been doing reasonably well since surgery. He does endorse expected soreness in his right groin expectedly at this point in the recovery process. Some "twinges" of pain with certain movements but these resolve on their own. Only taking tylenol/ibuprofen. No fever, chills, nausea, emesis, or bowel changes. No issues with incisions. No other complaints.   Vital signs: BP 121/84    Pulse 75    Temp 98 F (36.7 C)    Ht 5' 10.5" (1.791 m)    Wt 169 lb 12.8 oz (77 kg)    SpO2 99%    BMI 24.02 kg/m    Physical Exam: Constitutional: Well appearing male, NAD Abdomen: Soft, right groin soreness, non-distended, no rebound/guarding Skin: Laparoscopic incisions are well healed, no erythema  Assessment/Plan: This is a 40 y.o. male 11 days s/p robotic assisted laparoscopic right inguinal hernia repair   - Pain control prn  - Reviewed wound care  - Reviewed lifting restrictions; recommend 6 weeks total  - I expect he will continue to progress well. He can follow up with surgery clinic on as needed basis. He understands to call with questions/concerns   -- Lynden Oxford, PA-C [facility] 01/13/2021, 10:04 AM [phone] M-F: 7am - 4pm

## 2021-01-12 NOTE — Patient Instructions (Addendum)
Follow-up with our office as needed.  Please call and ask to speak with a nurse if you develop questions or concerns.   GENERAL POST-OPERATIVE PATIENT INSTRUCTIONS   WOUND CARE INSTRUCTIONS: Try to keep the wound dry and avoid ointments on the wound unless directed to do so.  If the wound becomes bright red and painful or starts to drain infected material that is not clear, please contact your physician immediately.  If the wound is mildly pink and has a thick firm ridge underneath it, this is normal, and is referred to as a healing ridge.  This will resolve over the next 4-6 weeks.  BATHING: You may shower if you have been informed of this by your surgeon. However, Please do not submerge in a tub, hot tub, or pool until incisions are completely sealed or have been told by your surgeon that you may do so.  DIET:  You may eat any foods that you can tolerate.  It is a good idea to eat a high fiber diet and take in plenty of fluids to prevent constipation.  If you do become constipated you may want to take a mild laxative or take ducolax tablets on a daily basis until your bowel habits are regular.  Constipation can be very uncomfortable, along with straining, after recent surgery.  ACTIVITY: You are encouraged to walk and engage in light activity for the next two weeks.  You should not lift more than 20 pounds for 4-6 weeks after surgery as it could put you at increased risk for complications.  Twenty pounds is roughly equivalent to a plastic bag of groceries. At that time- Listen to your body when lifting, if you have pain when lifting, stop and then try again in a few days. Soreness after doing exercises or activities of daily living is normal as you get back in to your normal routine.  MEDICATIONS:  Try to take narcotic medications and anti-inflammatory medications, such as tylenol, ibuprofen, naprosyn, etc., with food.  This will minimize stomach upset from the medication.  Should you develop  nausea and vomiting from the pain medication, or develop a rash, please discontinue the medication and contact your physician.  You should not drive, make important decisions, or operate machinery when taking narcotic pain medication.  SUNBLOCK Use sun block to incision area over the next year if this area will be exposed to sun. This helps decrease scarring and will allow you avoid a permanent darkened area over your incision.  QUESTIONS:  Please feel free to call our office if you have any questions, and we will be glad to assist you.    

## 2021-01-13 ENCOUNTER — Encounter: Payer: Self-pay | Admitting: Physician Assistant

## 2021-07-14 ENCOUNTER — Ambulatory Visit: Payer: Commercial Managed Care - PPO | Admitting: Family Medicine

## 2021-07-14 ENCOUNTER — Encounter: Payer: Self-pay | Admitting: Family Medicine

## 2021-07-14 VITALS — BP 110/68 | HR 58 | Ht 70.5 in | Wt 173.2 lb

## 2021-07-14 DIAGNOSIS — H6981 Other specified disorders of Eustachian tube, right ear: Secondary | ICD-10-CM | POA: Diagnosis not present

## 2021-07-14 DIAGNOSIS — H9311 Tinnitus, right ear: Secondary | ICD-10-CM

## 2021-07-14 DIAGNOSIS — H65191 Other acute nonsuppurative otitis media, right ear: Secondary | ICD-10-CM

## 2021-07-14 MED ORDER — FLUTICASONE PROPIONATE 50 MCG/ACT NA SUSP: 2.0000 | Freq: Every day | NASAL | 3 refills | Status: AC

## 2021-07-14 NOTE — Patient Instructions (Addendum)
Thank you for coming to the office today.  You have some Eustachian Tube Dysfunction, this problem is usually caused by some deeper sinus swelling and pressure, causing difficulty of eustachian tubes to clear fluid from behind ear drum. You can have ear pain, pressure, fullness, loss of hearing. Often related to sinus symptoms and sometimes with sinusitis or infection or allergy symptoms.  Treatment: - Start using nasal steroid spray, Flonase 2 sprays in each nostril every day for at least 4-6 weeks, and maybe longer - Start OTC allergy medicine (Claritin, Zyrtec, or Allegra - or generics) once daily - For significant ear/sinus pressure, try OTC decongestant such as original Sudafed (behind the counter, the quicker acting 4-6 hour version is fine)  [facility]NT [facility] [address]  [place], [place] [address] Ph: [phone]  [facility] ENT [facility] [address]  [place], [place] [address] Ph: [phone]  Please schedule a Follow-up Appointment to: Return if symptoms worsen or fail to improve.  If you have any other questions or concerns, please feel free to call the office or send a message through MyChart. You may also schedule an earlier appointment if necessary.  Additionally, you may be receiving a survey about your experience at our office within a few days to 1 week by e-mail or mail. We value your feedback.  Saralyn Pilar, DO [facility], [place]

## 2021-07-14 NOTE — Progress Notes (Signed)
Subjective:    Patient ID: Reginald Yoder, male    DOB: 06-Feb-1980, 41 y.o.   MRN: [id number]  Reginald Yoder is a 41 y.o. male presenting on 07/14/2021 for Tinnitus   HPI  Tinnitus, R ear  New onset problem, Tinnitus Started on Saturday (4 days ago). He noticed R ear was ringing, he was doing yardwork, mowing / cutting grass and he had over-ear cups on for sound protection and also has bluetooth speaker so he can listen to music while doing the yardwork.  He admits some sinus symptoms recently, took a Tylenol sinus decongestant PRN with some relief but did not help the ringing in the ear.  He had some left over antibiotic ear drops and he has used it 2-3 days. No further   He can still hear out of Right ear, but it is more difficult than normal.  High pitched ringing, constant. Not intermittent.  Admits rare occasion since it started with felt "off slightly" but not spinning or dizziness  Admits mild headache PRN improved w/ Tylenol and Ibuprofen sinus      07/14/2021    2:18 PM 12/01/2020    1:14 PM 08/27/2020    9:34 AM  Depression screen PHQ 2/9  Decreased Interest 0 0 0  Down, Depressed, Hopeless 0 0 0  PHQ - 2 Score 0 0 0  Altered sleeping 0 0 0  Tired, decreased energy 1 0 0  Change in appetite 0 0 0  Feeling bad or failure about yourself  0 0 0  Trouble concentrating 0 0 0  Moving slowly or fidgety/restless 0 0 0  Suicidal thoughts 0 0 0  PHQ-9 Score 1 0 0  Difficult doing work/chores Not difficult at all Not difficult at all Not difficult at all    Social History   Tobacco Use   Smoking status: Former   Smokeless tobacco: Former    Types: Chew   Tobacco comments:    chewing tobacco for 1 year  Vaping Use   Vaping Use: Never used  Substance Use Topics   Alcohol use: Yes    Alcohol/week: 1.0 - 2.0 standard drink of alcohol    Types: 1 - 2 Standard drinks or equivalent per week    Comment: rare   Drug use: Not Currently    Types: Marijuana     Review of Systems Per HPI unless specifically indicated above     Objective:    BP 110/68   Pulse (!) 58   Ht 5' 10.5" (1.791 m)   Wt 173 lb 3.2 oz (78.6 kg)   SpO2 100%   BMI 24.50 kg/m   Wt Readings from Last 3 Encounters:  07/14/21 173 lb 3.2 oz (78.6 kg)  01/12/21 169 lb 12.8 oz (77 kg)  01/01/21 175 lb 3.2 oz (79.5 kg)    Physical Exam Vitals and nursing note reviewed.  Constitutional:      General: He is not in acute distress.    Appearance: Normal appearance. He is well-developed. He is not diaphoretic.     Comments: Well-appearing, comfortable, cooperative  HENT:     Head: Normocephalic and atraumatic.     Right Ear: Ear canal and external ear normal. There is no impacted cerumen.     Left Ear: Ear canal and external ear normal. There is no impacted cerumen.     Ears:     Comments: R TM with mild effusion middle ear, no erythema, slight fullness.  L TM mostly normal  minimal effusion. Eyes:     General:        Right eye: No discharge.        Left eye: No discharge.     Conjunctiva/sclera: Conjunctivae normal.  Cardiovascular:     Rate and Rhythm: Normal rate.  Pulmonary:     Effort: Pulmonary effort is normal.  Skin:    General: Skin is warm and dry.     Findings: No erythema or rash.  Neurological:     Mental Status: He is alert and oriented to person, place, and time.  Psychiatric:        Mood and Affect: Mood normal.        Behavior: Behavior normal.        Thought Content: Thought content normal.     Comments: Well groomed, good eye contact, normal speech and thoughts    Results for orders placed or performed in visit on 08/27/20  COMPLETE METABOLIC PANEL WITH GFR  Result Value Ref Range   Glucose, Bld 86 65 - 99 mg/dL   BUN 19 7 - 25 mg/dL   Creat 1.02 0.60 - 1.26 mg/dL   eGFR 96 > OR = 60 mL/min/1.87m   BUN/Creatinine Ratio NOT APPLICABLE 6 - 22 (calc)   Sodium 137 135 - 146 mmol/L   Potassium 4.4 3.5 - 5.3 mmol/L   Chloride 105 98 -  110 mmol/L   CO2 26 20 - 32 mmol/L   Calcium 9.5 8.6 - 10.3 mg/dL   Total Protein 6.9 6.1 - 8.1 g/dL   Albumin 4.6 3.6 - 5.1 g/dL   Globulin 2.3 1.9 - 3.7 g/dL (calc)   AG Ratio 2.0 1.0 - 2.5 (calc)   Total Bilirubin 0.6 0.2 - 1.2 mg/dL   Alkaline phosphatase (APISO) 58 36 - 130 U/L   AST 17 10 - 40 U/L   ALT 10 9 - 46 U/L  Lipid panel  Result Value Ref Range   Cholesterol 184 <200 mg/dL   HDL 48 > OR = 40 mg/dL   Triglycerides 64 <150 mg/dL   LDL Cholesterol (Calc) 120 (H) mg/dL (calc)   Total CHOL/HDL Ratio 3.8 <5.0 (calc)   Non-HDL Cholesterol (Calc) 136 (H) <130 mg/dL (calc)  CBC with Differential/Platelet  Result Value Ref Range   WBC 5.1 3.8 - 10.8 Thousand/uL   RBC 4.69 4.20 - 5.80 Million/uL   Hemoglobin 14.7 13.2 - 17.1 g/dL   HCT 42.9 38.5 - 50.0 %   MCV 91.5 80.0 - 100.0 fL   MCH 31.3 27.0 - 33.0 pg   MCHC 34.3 32.0 - 36.0 g/dL   RDW 12.4 11.0 - 15.0 %   Platelets 250 140 - 400 Thousand/uL   MPV 10.6 7.5 - 12.5 fL   Neutro Abs 2,836 1,500 - 7,800 cells/uL   Lymphs Abs 1,535 850 - 3,900 cells/uL   Absolute Monocytes 479 200 - 950 cells/uL   Eosinophils Absolute 209 15 - 500 cells/uL   Basophils Absolute 41 0 - 200 cells/uL   Neutrophils Relative % 55.6 %   Total Lymphocyte 30.1 %   Monocytes Relative 9.4 %   Eosinophils Relative 4.1 %   Basophils Relative 0.8 %      Assessment & Plan:   Problem List Items Addressed This Visit   None Visit Diagnoses     Tinnitus, right ear    -  Primary   Relevant Medications   fluticasone (FLONASE) 50 MCG/ACT nasal spray   Other Relevant Orders  Ambulatory referral to ENT   Dysfunction of right eustachian tube       Relevant Medications   fluticasone (FLONASE) 50 MCG/ACT nasal spray   Other Relevant Orders   Ambulatory referral to ENT   Acute middle ear effusion, right       Relevant Orders   Ambulatory referral to ENT      Likely cause of Tinnitus is Eustachian Tube Dysfunction, this problem is usually  caused by some deeper sinus swelling and pressure, causing difficulty of eustachian tubes to clear fluid from behind ear drum. You can have ear pain, pressure, fullness, loss of hearing. Often related to sinus symptoms and sometimes with sinusitis or infection or allergy symptoms.  Otherwise would consider tinnitus due to acoustic trauma issues, but sounds like he is using ear hearing protection, will defer to ENT for further consult if not improving.  Treatment: - Start using nasal steroid spray, Flonase 2 sprays in each nostril every day for at least 4-6 weeks, and maybe longer - Start OTC allergy medicine (Claritin, Zyrtec, or Allegra - or generics) once daily - For significant ear/sinus pressure, try OTC decongestant such as original Sudafed (behind the counter, the quicker acting 4-6 hour version is fine)  Referral to ENT  Orders Placed This Encounter  Procedures   Ambulatory referral to ENT    Referral Priority:   Routine    Referral Type:   Consultation    Referral Reason:   Specialty Services Required    Requested Specialty:   Otolaryngology    Number of Visits Requested:   1      Meds ordered this encounter  Medications   fluticasone (FLONASE) 50 MCG/ACT nasal spray    Sig: Place 2 sprays into both nostrils daily. Use for 4-6 weeks then stop and use seasonally or as needed.    Dispense:  16 g    Refill:  3      Follow up plan: Return if symptoms worsen or fail to improve.  Nobie Putnam, [facility] 07/14/2021, 2:39 PM

## 2021-08-30 ENCOUNTER — Encounter: Payer: Self-pay | Admitting: Family Medicine

## 2021-08-30 ENCOUNTER — Ambulatory Visit (INDEPENDENT_AMBULATORY_CARE_PROVIDER_SITE_OTHER): Payer: Commercial Managed Care - PPO | Admitting: Family Medicine

## 2021-08-30 VITALS — BP 124/61 | HR 63 | Ht 70.5 in | Wt 175.2 lb

## 2021-08-30 DIAGNOSIS — E7800 Pure hypercholesterolemia, unspecified: Secondary | ICD-10-CM

## 2021-08-30 DIAGNOSIS — K219 Gastro-esophageal reflux disease without esophagitis: Secondary | ICD-10-CM

## 2021-08-30 DIAGNOSIS — Z0000 Encounter for general adult medical examination without abnormal findings: Secondary | ICD-10-CM

## 2021-08-30 MED ORDER — ESOMEPRAZOLE MAGNESIUM 40 MG PO CPDR: 40.0000 mg | DELAYED_RELEASE_CAPSULE | Freq: Every day | ORAL | 3 refills | Status: DC

## 2021-08-30 NOTE — Progress Notes (Signed)
Subjective:    Patient ID: Reginald Yoder, male    DOB: 16-Jan-1981, 41 y.o.   MRN: [id number]  Reginald Yoder is a 41 y.o. male presenting on 08/30/2021 for Annual Exam   HPI  Here for Annual Physical and Lab Review  GERD History original dx 2009 He has had upper GI endoscopy done in 09-10 in [place], no repeat done since then. Confirmed his diagnosis. No complications that would warrant repeat. He has not had any issues with this GERD since on medication. Previous symptoms with heartburn and chest tightness with physical activity and build up of gas. Improved with belching. He was on Zantac BID for past 9 years, then he switched to Omeprazole 43m - then wasn't covered. He was switched to generic Esomeprazole 265mdaily but was ineffective and increased to 4074maily about 1.5 years ago, and now doing well. Controlled on Esomeprazole 55m69mily needs future refills   History of Sinusitis / Allergies Improved on Flonase.   Lifestyle    Generally active. He does like to keep at gym and do strength training. Goal to go hiking with wife outdoors. He is active playing sports basketball.  Increasing exercise regimen past few months.   History of COVID19 02/2019   Health Maintenance: No updated COVID19 vaccine   Tdap 2018-2019, review record.   Declines Hep C      08/30/2021    8:46 AM 07/14/2021    2:18 PM 12/01/2020    1:14 PM  Depression screen PHQ 2/9  Decreased Interest 0 0 0  Down, Depressed, Hopeless 0 0 0  PHQ - 2 Score 0 0 0  Altered sleeping 0 0 0  Tired, decreased energy 1 1 0  Change in appetite 0 0 0  Feeling bad or failure about yourself  0 0 0  Trouble concentrating 0 0 0  Moving slowly or fidgety/restless 0 0 0  Suicidal thoughts 0 0 0  PHQ-9 Score 1 1 0  Difficult doing work/chores Not difficult at all Not difficult at all Not difficult at all    Past Medical History:  Diagnosis Date  . Allergy   . GERD (gastroesophageal reflux disease)    Past  Surgical History:  Procedure Laterality Date  . TONSILLECTOMY Bilateral 1987  . VASECTOMY  2017  . WISDOM TOOTH EXTRACTION Bilateral 2002   Social History   Socioeconomic History  . Marital status: Married    Spouse name: Not on file  . Number of children: Not on file  . Years of education: College  . Highest education level: Associate degree: occupational, techHotel manager vocational program  Occupational History  . Not on file  Tobacco Use  . Smoking status: Former  . Smokeless tobacco: Former    Types: Chew  . Tobacco comments:    chewing tobacco for 1 year  Vaping Use  . Vaping Use: Never used  Substance and Sexual Activity  . Alcohol use: Yes    Alcohol/week: 1.0 - 2.0 standard drink of alcohol    Types: 1 - 2 Standard drinks or equivalent per week    Comment: rare  . Drug use: Not Currently    Types: Marijuana  . Sexual activity: Not on file  Other Topics Concern  . Not on file  Social History Narrative  . Not on file   Social Determinants of Health   Financial Resource Strain: Not on file  Food Insecurity: Not on file  Transportation Needs: Not on file  Physical Activity: Not  on file  Stress: Not on file  Social Connections: Not on file  Intimate Partner Violence: Not on file   Family History  Problem Relation Age of Onset  . Lung cancer Maternal Grandfather 72       smoker  . Heart attack Maternal Grandfather   . Prostate cancer Neg Hx   . Colon cancer Neg Hx    Current Outpatient Medications on File Prior to Visit  Medication Sig  . cetirizine (ZYRTEC) 10 MG tablet Take 10 mg by mouth every morning.  . fluticasone (FLONASE) 50 MCG/ACT nasal spray Place 2 sprays into both nostrils daily. Use for 4-6 weeks then stop and use seasonally or as needed.   No current facility-administered medications on file prior to visit.    Review of Systems  Constitutional:  Negative for activity change, appetite change, chills, diaphoresis, fatigue and fever.  HENT:   Negative for congestion and hearing loss.   Eyes:  Negative for visual disturbance.  Respiratory:  Negative for cough, chest tightness, shortness of breath and wheezing.   Cardiovascular:  Negative for chest pain, palpitations and leg swelling.  Gastrointestinal:  Negative for abdominal pain, constipation, diarrhea, nausea and vomiting.  Genitourinary:  Negative for dysuria, frequency and hematuria.  Musculoskeletal:  Negative for arthralgias and neck pain.  Skin:  Negative for rash.  Neurological:  Negative for dizziness, weakness, light-headedness, numbness and headaches.  Hematological:  Negative for adenopathy.  Psychiatric/Behavioral:  Negative for behavioral problems, dysphoric mood and sleep disturbance.    Per HPI unless specifically indicated above      Objective:    BP 124/61   Pulse 63   Ht 5' 10.5" (1.791 m)   Wt 175 lb 3.2 oz (79.5 kg)   SpO2 99%   BMI 24.78 kg/m   Wt Readings from Last 3 Encounters:  08/30/21 175 lb 3.2 oz (79.5 kg)  07/14/21 173 lb 3.2 oz (78.6 kg)  01/12/21 169 lb 12.8 oz (77 kg)    Physical Exam Vitals and nursing note reviewed.  Constitutional:      General: He is not in acute distress.    Appearance: He is well-developed. He is not diaphoretic.     Comments: Well-appearing, comfortable, cooperative  HENT:     Head: Normocephalic and atraumatic.  Eyes:     General:        Right eye: No discharge.        Left eye: No discharge.     Conjunctiva/sclera: Conjunctivae normal.     Pupils: Pupils are equal, round, and reactive to light.  Neck:     Thyroid: No thyromegaly.     Vascular: No carotid bruit.  Cardiovascular:     Rate and Rhythm: Normal rate and regular rhythm.     Pulses: Normal pulses.     Heart sounds: Normal heart sounds. No murmur heard. Pulmonary:     Effort: Pulmonary effort is normal. No respiratory distress.     Breath sounds: Normal breath sounds. No wheezing or rales.  Abdominal:     General: Bowel sounds are  normal. There is no distension.     Palpations: Abdomen is soft. There is no mass.     Tenderness: There is no abdominal tenderness.  Musculoskeletal:        General: No tenderness. Normal range of motion.     Cervical back: Normal range of motion and neck supple.     Right lower leg: No edema.     Left lower leg:  No edema.     Comments: Upper / Lower Extremities: - Normal muscle tone, strength bilateral upper extremities 5/5, lower extremities 5/5  Lymphadenopathy:     Cervical: No cervical adenopathy.  Skin:    General: Skin is warm and dry.     Findings: No erythema or rash.  Neurological:     Mental Status: He is alert and oriented to person, place, and time.     Comments: Distal sensation intact to light touch all extremities  Psychiatric:        Mood and Affect: Mood normal.        Behavior: Behavior normal.        Thought Content: Thought content normal.     Comments: Well groomed, good eye contact, normal speech and thoughts     Results for orders placed or performed in visit on 08/27/20  COMPLETE METABOLIC PANEL WITH GFR  Result Value Ref Range   Glucose, Bld 86 65 - 99 mg/dL   BUN 19 7 - 25 mg/dL   Creat 1.02 0.60 - 1.26 mg/dL   eGFR 96 > OR = 60 mL/min/1.44m   BUN/Creatinine Ratio NOT APPLICABLE 6 - 22 (calc)   Sodium 137 135 - 146 mmol/L   Potassium 4.4 3.5 - 5.3 mmol/L   Chloride 105 98 - 110 mmol/L   CO2 26 20 - 32 mmol/L   Calcium 9.5 8.6 - 10.3 mg/dL   Total Protein 6.9 6.1 - 8.1 g/dL   Albumin 4.6 3.6 - 5.1 g/dL   Globulin 2.3 1.9 - 3.7 g/dL (calc)   AG Ratio 2.0 1.0 - 2.5 (calc)   Total Bilirubin 0.6 0.2 - 1.2 mg/dL   Alkaline phosphatase (APISO) 58 36 - 130 U/L   AST 17 10 - 40 U/L   ALT 10 9 - 46 U/L  Lipid panel  Result Value Ref Range   Cholesterol 184 <200 mg/dL   HDL 48 > OR = 40 mg/dL   Triglycerides 64 <150 mg/dL   LDL Cholesterol (Calc) 120 (H) mg/dL (calc)   Total CHOL/HDL Ratio 3.8 <5.0 (calc)   Non-HDL Cholesterol (Calc) 136 (H)  <130 mg/dL (calc)  CBC with Differential/Platelet  Result Value Ref Range   WBC 5.1 3.8 - 10.8 Thousand/uL   RBC 4.69 4.20 - 5.80 Million/uL   Hemoglobin 14.7 13.2 - 17.1 g/dL   HCT 42.9 38.5 - 50.0 %   MCV 91.5 80.0 - 100.0 fL   MCH 31.3 27.0 - 33.0 pg   MCHC 34.3 32.0 - 36.0 g/dL   RDW 12.4 11.0 - 15.0 %   Platelets 250 140 - 400 Thousand/uL   MPV 10.6 7.5 - 12.5 fL   Neutro Abs 2,836 1,500 - 7,800 cells/uL   Lymphs Abs 1,535 850 - 3,900 cells/uL   Absolute Monocytes 479 200 - 950 cells/uL   Eosinophils Absolute 209 15 - 500 cells/uL   Basophils Absolute 41 0 - 200 cells/uL   Neutrophils Relative % 55.6 %   Total Lymphocyte 30.1 %   Monocytes Relative 9.4 %   Eosinophils Relative 4.1 %   Basophils Relative 0.8 %      Assessment & Plan:   Problem List Items Addressed This Visit     Gastroesophageal reflux disease   Relevant Medications   esomeprazole (NEXIUM) 40 MG capsule   Other Visit Diagnoses     Annual physical exam    -  Primary   Relevant Orders   COMPLETE METABOLIC PANEL WITH GFR  CBC with Differential/Platelet   Lipid panel   Elevated LDL cholesterol level       Relevant Orders   COMPLETE METABOLIC PANEL WITH GFR   Lipid panel       Updated Health Maintenance information Fasting labs ordered today, pending review Encouraged improvement to lifestyle with diet and exercise Goal of weight loss  Continue PPI therapy with nexium generic   Meds ordered this encounter  Medications  . esomeprazole (NEXIUM) 40 MG capsule    Sig: Take 1 capsule (40 mg total) by mouth daily before breakfast.    Dispense:  90 capsule    Refill:  3    Please add refills on file, patient may not need filled yet. Thank you      Follow up plan: Return in about 1 year (around 08/31/2022) for 1 year Annual Physical AM apt fasting lab AFTER.  Nobie Putnam, [facility] 08/30/2021, 8:49 AM

## 2021-08-30 NOTE — Patient Instructions (Addendum)
Thank you for coming to the office today.  Labs ordered today stay tuned on mychart.  Re ordered the generic Nexium 40mg  daily, for 1 year, keep on file at pharmacy.  No other vaccines or testing due today.   DUE for FASTING BLOOD WORK (no food or drink after midnight before the lab appointment, only water or coffee without cream/sugar on the morning of)  SCHEDULE "Lab Only" visit in the morning at the clinic for lab draw in 1 YEAR  - Make sure Lab Only appointment is at about 1 week before your next appointment, so that results will be available  For Lab Results, once available within 2-3 days of blood draw, you can can log in to MyChart online to view your results and a brief explanation. Also, we can discuss results at next follow-up visit.   Please schedule a Follow-up Appointment to: Return in about 1 year (around 08/31/2022) for 1 year Annual Physical AM apt fasting lab AFTER.  If you have any other questions or concerns, please feel free to call the office or send a message through MyChart. You may also schedule an earlier appointment if necessary.  Additionally, you may be receiving a survey about your experience at our office within a few days to 1 week by e-mail or mail. We value your feedback.  10/31/2022, DO [facility], [facility]

## 2021-08-31 LAB — CBC WITH DIFFERENTIAL/PLATELET
Absolute Monocytes: 509 cells/uL (ref 200–950)
Basophils Absolute: 38 cells/uL (ref 0–200)
Basophils Relative: 0.8 %
Eosinophils Absolute: 110 cells/uL (ref 15–500)
Eosinophils Relative: 2.3 %
HCT: 41.9 % (ref 38.5–50.0)
Hemoglobin: 14.3 g/dL (ref 13.2–17.1)
Lymphs Abs: 1416 cells/uL (ref 850–3900)
MCH: 31.6 pg (ref 27.0–33.0)
MCHC: 34.1 g/dL (ref 32.0–36.0)
MCV: 92.7 fL (ref 80.0–100.0)
MPV: 11 fL (ref 7.5–12.5)
Monocytes Relative: 10.6 %
Neutro Abs: 2726 cells/uL (ref 1500–7800)
Neutrophils Relative %: 56.8 %
Platelets: 258 10*3/uL (ref 140–400)
RBC: 4.52 10*6/uL (ref 4.20–5.80)
RDW: 12.4 % (ref 11.0–15.0)
Total Lymphocyte: 29.5 %
WBC: 4.8 10*3/uL (ref 3.8–10.8)

## 2021-08-31 LAB — LIPID PANEL
Cholesterol: 167 mg/dL (ref <200)
HDL: 45 mg/dL (ref >=40)
LDL Cholesterol (Calc): 105 mg/dL (calc) — ABNORMAL HIGH
Non-HDL Cholesterol (Calc): 122 mg/dL (calc) (ref <130)
Total CHOL/HDL Ratio: 3.7 (calc) (ref <5.0)
Triglycerides: 82 mg/dL (ref <150)

## 2021-08-31 LAB — COMPLETE METABOLIC PANEL WITH GFR
AG Ratio: 1.9 (calc) (ref 1.0–2.5)
ALT: 24 U/L (ref 9–46)
AST: 23 U/L (ref 10–40)
Albumin: 4.5 g/dL (ref 3.6–5.1)
Alkaline phosphatase (APISO): 54 U/L (ref 36–130)
BUN: 20 mg/dL (ref 7–25)
CO2: 28 mmol/L (ref 20–32)
Calcium: 9.5 mg/dL (ref 8.6–10.3)
Chloride: 107 mmol/L (ref 98–110)
Creat: 1.25 mg/dL (ref 0.60–1.26)
Globulin: 2.4 g/dL (calc) (ref 1.9–3.7)
Glucose, Bld: 89 mg/dL (ref 65–99)
Potassium: 4.3 mmol/L (ref 3.5–5.3)
Sodium: 141 mmol/L (ref 135–146)
Total Bilirubin: 0.4 mg/dL (ref 0.2–1.2)
Total Protein: 6.9 g/dL (ref 6.1–8.1)
eGFR: 75 mL/min/{1.73_m2} (ref >=60)

## 2021-10-22 ENCOUNTER — Other Ambulatory Visit: Payer: Self-pay | Admitting: Family Medicine

## 2021-10-22 DIAGNOSIS — K219 Gastro-esophageal reflux disease without esophagitis: Secondary | ICD-10-CM

## 2021-10-22 NOTE — Telephone Encounter (Signed)
Change of pharmacy Requested Prescriptions  Pending Prescriptions Disp Refills  . esomeprazole (NEXIUM) 40 MG capsule [Pharmacy Med Name: ESOMEPRA MAG CAP 40MG  DR] 90 capsule 2    Sig: TAKE [place]     Gastroenterology: Proton Pump Inhibitors 2 Passed - 10/22/2021  8:07 AM      Passed - ALT in normal range and within 360 days    ALT  Date Value Ref Range Status  08/30/2021 24 9 - 46 U/L Final         Passed - AST in normal range and within 360 days    AST  Date Value Ref Range Status  08/30/2021 23 10 - 40 U/L Final         Passed - Valid encounter within last 12 months    Recent Outpatient Visits          1 month ago Annual physical exam   [place], DO   3 months ago Tinnitus, right ear   [place], DO   10 months ago Right inguinal hernia   [place], DO   1 year ago Annual physical exam   [facility] [place], Devonne Doughty, DO   2 years ago Gastroesophageal reflux disease without esophagitis   [place], DO      Future Appointments            In 10 months [place], Devonne Doughty, DO [facility], [facility]

## 2021-11-18 ENCOUNTER — Ambulatory Visit: Payer: Commercial Managed Care - PPO | Admitting: Family Medicine

## 2021-11-18 ENCOUNTER — Encounter: Payer: Self-pay | Admitting: Family Medicine

## 2021-11-18 VITALS — BP 124/82 | HR 77 | Temp 98.1°F | Ht 70.0 in | Wt 173.0 lb

## 2021-11-18 DIAGNOSIS — J0110 Acute frontal sinusitis, unspecified: Secondary | ICD-10-CM | POA: Diagnosis not present

## 2021-11-18 MED ORDER — AMOXICILLIN-POT CLAVULANATE 875-125 MG PO TABS: 1.0000 | ORAL_TABLET | Freq: Two times a day (BID) | ORAL | 0 refills | Status: DC

## 2021-11-18 MED ORDER — BENZONATATE 100 MG PO CAPS: 100.0000 mg | ORAL_CAPSULE | Freq: Three times a day (TID) | ORAL | 0 refills | Status: DC | PRN

## 2021-11-18 NOTE — Progress Notes (Signed)
Subjective:    Patient ID: Reginald Yoder, male    DOB: Jan 05, 1981, 41 y.o.   MRN: [id number]  Melik Blancett is a 41 y.o. male presenting on 11/18/2021 for Sore Throat (X 4 days, Sinus pressure, headache, no fever, no chills, body aches, negative covid test yesterday 10/25, tried sudafed and nasal spray, cough yellow mucous, doesn't really hurt to swallow)   HPI  Sinusitis Reports symptoms started 4 days ago, sinus, thicker yellow sinus drainage, pressure, some drainage sore throat. No other sick contacts. Similar to prior sinus infection 10/2020. - Home COVID test negative yesterday 10/25 Tried OTC Sudafed, Flonase, Atrovent nasal PRN Denies fever dyspnea productive cough      08/30/2021    8:46 AM 07/14/2021    2:18 PM 12/01/2020    1:14 PM  Depression screen PHQ 2/9  Decreased Interest 0 0 0  Down, Depressed, Hopeless 0 0 0  PHQ - 2 Score 0 0 0  Altered sleeping 0 0 0  Tired, decreased energy 1 1 0  Change in appetite 0 0 0  Feeling bad or failure about yourself  0 0 0  Trouble concentrating 0 0 0  Moving slowly or fidgety/restless 0 0 0  Suicidal thoughts 0 0 0  PHQ-9 Score 1 1 0  Difficult doing work/chores Not difficult at all Not difficult at all Not difficult at all    Social History   Tobacco Use   Smoking status: Former   Smokeless tobacco: Former    Types: Chew   Tobacco comments:    chewing tobacco for 1 year  Vaping Use   Vaping Use: Never used  Substance Use Topics   Alcohol use: Yes    Alcohol/week: 1.0 - 2.0 standard drink of alcohol    Types: 1 - 2 Standard drinks or equivalent per week    Comment: rare   Drug use: Not Currently    Types: Marijuana    Review of Systems Per HPI unless specifically indicated above     Objective:    BP 124/82   Pulse 77   Temp 98.1 F (36.7 C) (Temporal)   Ht 5' 10"  (1.778 m)   Wt 173 lb (78.5 kg)   SpO2 99%   BMI 24.82 kg/m   Wt Readings from Last 3 Encounters:  11/18/21 173 lb (78.5 kg)   08/30/21 175 lb 3.2 oz (79.5 kg)  07/14/21 173 lb 3.2 oz (78.6 kg)    Physical Exam Vitals and nursing note reviewed.  Constitutional:      General: He is not in acute distress.    Appearance: Normal appearance. He is well-developed. He is not diaphoretic.     Comments: Well-appearing, comfortable, cooperative  HENT:     Head: Normocephalic and atraumatic.  Eyes:     General:        Right eye: No discharge.        Left eye: No discharge.     Conjunctiva/sclera: Conjunctivae normal.  Cardiovascular:     Rate and Rhythm: Normal rate.  Pulmonary:     Effort: Pulmonary effort is normal.  Skin:    General: Skin is warm and dry.     Findings: No erythema or rash.  Neurological:     Mental Status: He is alert and oriented to person, place, and time.  Psychiatric:        Mood and Affect: Mood normal.        Behavior: Behavior normal.  Thought Content: Thought content normal.     Comments: Well groomed, good eye contact, normal speech and thoughts      Results for orders placed or performed in visit on 08/30/21  COMPLETE METABOLIC PANEL WITH GFR  Result Value Ref Range   Glucose, Bld 89 65 - 99 mg/dL   BUN 20 7 - 25 mg/dL   Creat 1.25 0.60 - 1.26 mg/dL   eGFR 75 > OR = 60 mL/min/1.5m   BUN/Creatinine Ratio SEE NOTE: 6 - 22 (calc)   Sodium 141 135 - 146 mmol/L   Potassium 4.3 3.5 - 5.3 mmol/L   Chloride 107 98 - 110 mmol/L   CO2 28 20 - 32 mmol/L   Calcium 9.5 8.6 - 10.3 mg/dL   Total Protein 6.9 6.1 - 8.1 g/dL   Albumin 4.5 3.6 - 5.1 g/dL   Globulin 2.4 1.9 - 3.7 g/dL (calc)   AG Ratio 1.9 1.0 - 2.5 (calc)   Total Bilirubin 0.4 0.2 - 1.2 mg/dL   Alkaline phosphatase (APISO) 54 36 - 130 U/L   AST 23 10 - 40 U/L   ALT 24 9 - 46 U/L  CBC with Differential/Platelet  Result Value Ref Range   WBC 4.8 3.8 - 10.8 Thousand/uL   RBC 4.52 4.20 - 5.80 Million/uL   Hemoglobin 14.3 13.2 - 17.1 g/dL   HCT 41.9 38.5 - 50.0 %   MCV 92.7 80.0 - 100.0 fL   MCH 31.6 27.0 -  33.0 pg   MCHC 34.1 32.0 - 36.0 g/dL   RDW 12.4 11.0 - 15.0 %   Platelets 258 140 - 400 Thousand/uL   MPV 11.0 7.5 - 12.5 fL   Neutro Abs 2,726 1,500 - 7,800 cells/uL   Lymphs Abs 1,416 850 - 3,900 cells/uL   Absolute Monocytes 509 200 - 950 cells/uL   Eosinophils Absolute 110 15 - 500 cells/uL   Basophils Absolute 38 0 - 200 cells/uL   Neutrophils Relative % 56.8 %   Total Lymphocyte 29.5 %   Monocytes Relative 10.6 %   Eosinophils Relative 2.3 %   Basophils Relative 0.8 %  Lipid panel  Result Value Ref Range   Cholesterol 167 <200 mg/dL   HDL 45 > OR = 40 mg/dL   Triglycerides 82 <150 mg/dL   LDL Cholesterol (Calc) 105 (H) mg/dL (calc)   Total CHOL/HDL Ratio 3.7 <5.0 (calc)   Non-HDL Cholesterol (Calc) 122 <130 mg/dL (calc)      Assessment & Plan:   Problem List Items Addressed This Visit   None Visit Diagnoses     Acute non-recurrent frontal sinusitis    -  Primary   Relevant Medications   amoxicillin-clavulanate (AUGMENTIN) 875-125 MG tablet   benzonatate (TESSALON) 100 MG capsule       Sinusitis (Bacterial Infection) - this most likely started as an Upper Respiratory Virus that has settled into an infection. Allergies can also cause this. - Start Augmentin 1 pill twice daily (breakfast and dinner, with food and plenty of water) for 10 days, complete entire course, do not stop early even if feeling better - Keep nasal sprays, sudafed as needed - Ibuprofen 400-6062m3 times a day with meal - Start Tessalon Perls take 1 capsule up to 3 times a day as needed for cough  Meds ordered this encounter  Medications   amoxicillin-clavulanate (AUGMENTIN) 875-125 MG tablet    Sig: Take 1 tablet by mouth 2 (two) times daily.    Dispense:  20 tablet  Refill:  0   benzonatate (TESSALON) 100 MG capsule    Sig: Take 1 capsule (100 mg total) by mouth 3 (three) times daily as needed for cough.    Dispense:  30 capsule    Refill:  0      Follow up plan: Return if symptoms  worsen or fail to improve.   Nobie Putnam, [facility] 11/18/2021, 10:32 AM

## 2021-11-18 NOTE — Patient Instructions (Addendum)
Thank you for coming to the office today.  1. It sounds like you have a Sinusitis (Bacterial Infection) - this most likely started as an Upper Respiratory Virus that has settled into an infection. Allergies can also cause this. - Start Augmentin 1 pill twice daily (breakfast and dinner, with food and plenty of water) for 10 days, complete entire course, do not stop early even if feeling better - Keep nasal sprays, sudafed as needed - Ibuprofen 400-600mg  3 times a day with meal - Start Tessalon Perls take 1 capsule up to 3 times a day as needed for cough  Please schedule a Follow-up Appointment to: Return if symptoms worsen or fail to improve.  If you have any other questions or concerns, please feel free to call the office or send a message through [facility]. You may also schedule an earlier appointment if necessary.  Additionally, you may be receiving a survey about your experience at our office within a few days to 1 week by e-mail or mail. We value your feedback.  Nobie Putnam, DO [place]

## 2022-02-15 ENCOUNTER — Ambulatory Visit (INDEPENDENT_AMBULATORY_CARE_PROVIDER_SITE_OTHER): Payer: Commercial Managed Care - PPO | Admitting: Family Medicine

## 2022-02-15 ENCOUNTER — Encounter: Payer: Self-pay | Admitting: Family Medicine

## 2022-02-15 VITALS — BP 106/70 | HR 70 | Ht 70.0 in | Wt 145.0 lb

## 2022-02-15 DIAGNOSIS — R350 Frequency of micturition: Secondary | ICD-10-CM

## 2022-02-15 DIAGNOSIS — N3001 Acute cystitis with hematuria: Secondary | ICD-10-CM | POA: Diagnosis not present

## 2022-02-15 LAB — POCT URINALYSIS DIPSTICK
Bilirubin, UA: NEGATIVE
Glucose, UA: NEGATIVE
Ketones, UA: NEGATIVE
Leukocytes, UA: NEGATIVE
Nitrite, UA: NEGATIVE
Protein, UA: NEGATIVE
Spec Grav, UA: 1.01 (ref 1.010–1.025)
Urobilinogen, UA: 0.2 E.U./dL
pH, UA: 7 (ref 5.0–8.0)

## 2022-02-15 MED ORDER — CEPHALEXIN 500 MG PO CAPS: 500.0000 mg | ORAL_CAPSULE | Freq: Three times a day (TID) | ORAL | 0 refills | Status: DC

## 2022-02-15 NOTE — Patient Instructions (Addendum)
Thank you for coming to the office today.  1. You have a Urinary Tract Infection - this is very common, your symptoms are reassuring and you should get better within 1 week on the antibiotics  - Start Keflex 500mg  3 times daily for next 7 days, complete entire course, even if feeling better - We sent urine for a culture, we will call you within next few days if we need to change antibiotics - Please drink plenty of fluids, improve hydration over next 1 week  If symptoms worsening, developing nausea / vomiting, worsening back pain, fevers / chills / sweats, then please return for re-evaluation sooner.  If you take AZO OTC - limit this to 2-3 days MAX to avoid affecting kidneys  D-Mannose is a natural supplement that can actually help bind to urinary bacteria and reduce their effectiveness it can help prevent UTI from forming, and may reduce some symptoms. It likely cannot cure an active UTI but it is worth a try and good to prevent them with. Try 500mg  twice a day at a full dose if you want, or check package instructions for more info  Try to observe frequency maybe related to intercourse.  If not improved, let me know we can refer to Urologist  Please schedule a Follow-up Appointment to: Return if symptoms worsen or fail to improve.  If you have any other questions or concerns, please feel free to call the office or send a message through [facility]. You may also schedule an earlier appointment if necessary.  Additionally, you may be receiving a survey about your experience at our office within a few days to 1 week by e-mail or mail. We value your feedback.  Nobie Putnam, DO [place]

## 2022-02-15 NOTE — Progress Notes (Signed)
Subjective:    Patient ID: Reginald Yoder, male    DOB: [birth year], 42 y.o.   MRN: [id number]  Reginald Yoder is a 42 y.o. male presenting on 02/15/2022 for Urinary Frequency  Patient presents for a same day appointment.  HPI  Urinary Frequency / UTI  History of similar issue back in May 2022, he was seen at [facility] with urinary frequency, some mild discomfort dysuria, tried AZO without relief. He had testing at that time with urinalysis that was negative or unable to be read due to AZO, and they did urine culture that was negative, no antibiotics or other treatments given and it spontaneously resolved back to baseline normal.  Now return of very similar symptoms 1.5 years later He reports onset now with 1 week of persistent symptoms urinary frequency, dysuria discomfort, no gross hematuria seen by him or color change. Usually smaller volume of urine but has to go often.  Drinks green tea 1 cup daily, mostly water. Usually good fluid intake. No other caffeine, no alcohol. He remains busy at work and does not drink enough water.  Denies any nausea vomiting flank pain back pain fever chills      02/15/2022    3:54 PM 08/30/2021    8:46 AM 07/14/2021    2:18 PM  Depression screen PHQ 2/9  Decreased Interest 0 0 0  Down, Depressed, Hopeless 0 0 0  PHQ - 2 Score 0 0 0  Altered sleeping 0 0 0  Tired, decreased energy 0 1 1  Change in appetite 0 0 0  Feeling bad or failure about yourself  0 0 0  Trouble concentrating 0 0 0  Moving slowly or fidgety/restless 0 0 0  Suicidal thoughts 0 0 0  PHQ-9 Score 0 1 1  Difficult doing work/chores Not difficult at all Not difficult at all Not difficult at all    Social History   Tobacco Use   Smoking status: Former   Smokeless tobacco: Former    Types: Chew   Tobacco comments:    chewing tobacco for 1 year  Vaping Use   Vaping Use: Never used  Substance Use Topics   Alcohol use: Yes    Alcohol/week: 1.0 - 2.0 standard  drink of alcohol    Types: 1 - 2 Standard drinks or equivalent per week    Comment: rare   Drug use: Not Currently    Types: Marijuana    Review of Systems Per HPI unless specifically indicated above     Objective:    BP 106/70   Pulse 70   Ht 5\' 10"  (2.536 m)   Wt 145 lb (65.8 kg)   SpO2 100%   BMI 20.81 kg/m   Wt Readings from Last 3 Encounters:  02/15/22 145 lb (65.8 kg)  11/18/21 173 lb (78.5 kg)  08/30/21 175 lb 3.2 oz (79.5 kg)    Physical Exam Vitals and nursing note reviewed.  Constitutional:      General: He is not in acute distress.    Appearance: Normal appearance. He is well-developed. He is not diaphoretic.     Comments: Well-appearing, comfortable, cooperative  HENT:     Head: Normocephalic and atraumatic.  Eyes:     General:        Right eye: No discharge.        Left eye: No discharge.     Conjunctiva/sclera: Conjunctivae normal.  Cardiovascular:     Rate and Rhythm: Normal rate.  Pulmonary:  Effort: Pulmonary effort is normal.  Skin:    General: Skin is warm and dry.     Findings: No erythema or rash.  Neurological:     Mental Status: He is alert and oriented to person, place, and time.  Psychiatric:        Mood and Affect: Mood normal.        Behavior: Behavior normal.        Thought Content: Thought content normal.     Comments: Well groomed, good eye contact, normal speech and thoughts      Results for orders placed or performed in visit on 02/15/22  POCT Urinalysis Dipstick  Result Value Ref Range   Color, UA Yellow    Clarity, UA Cloudy    Glucose, UA Negative Negative   Bilirubin, UA Negative    Ketones, UA Negative    Spec Grav, UA 1.010 1.010 - 1.025   Blood, UA Trace    pH, UA 7.0 5.0 - 8.0   Protein, UA Negative Negative   Urobilinogen, UA 0.2 0.2 or 1.0 E.U./dL   Nitrite, UA Negative    Leukocytes, UA Negative Negative   Appearance     Odor        Assessment & Plan:   Problem List Items Addressed This Visit    None Visit Diagnoses     Acute cystitis with hematuria    -  Primary   Relevant Medications   cephALEXin (KEFLEX) 500 MG capsule   Urinary frequency       Relevant Orders   POCT Urinalysis Dipstick (Completed)   Urine Culture       Clinically consistent with UTI and confirmed on UA. No recent UTIs or abx courses. Mild uncomplicated Uncertain etiology in age 37 healthy male without type 2 diabetes or other risk factors. No concern for pyelo today (no systemic symptoms, neg fever, back pain, n/v).  Note past UTI symptoms 05/2020 - self limited, UC visit, no growth on urine culture, no antibiotics self limited episode.  Plan: 1. UA dipstick positive for trace blood otherwise negative. 2. Ordered Urine culture 3. Keflex 500mg  TID x 7 days, empiric course based on clinical symptoms. 4. Improve PO hydration, limit caffeine  Consider Referral to Urology if not improving on treatment course. Or frequent recurrence of UTI.  Additionally discussed possible cause with following sexual intercourse with wife, he does not use condom due to vasectomy, may consider possible introduction of bacteria through this avenue.  He will monitor frequency and consider prevention methods cranberry/D-Mannose as well.  Orders Placed This Encounter  Procedures   Urine Culture   POCT Urinalysis Dipstick     Meds ordered this encounter  Medications   cephALEXin (KEFLEX) 500 MG capsule    Sig: Take 1 capsule (500 mg total) by mouth 3 (three) times daily. For 7 days    Dispense:  21 capsule    Refill:  0     Follow up plan: Return if symptoms worsen or fail to improve.    Nobie Putnam, [facility] 02/15/2022, 4:06 PM

## 2022-02-16 LAB — URINE CULTURE
MICRO NUMBER:: 14462293
Result:: NO GROWTH
SPECIMEN QUALITY:: ADEQUATE

## 2022-02-22 ENCOUNTER — Encounter: Payer: Self-pay | Admitting: Family Medicine

## 2022-02-22 DIAGNOSIS — R350 Frequency of micturition: Secondary | ICD-10-CM

## 2022-02-22 DIAGNOSIS — N3001 Acute cystitis with hematuria: Secondary | ICD-10-CM

## 2022-02-23 NOTE — Addendum Note (Signed)
Addended by: Olin Hauser on: 02/23/2022 05:27 PM   Modules accepted: Orders

## 2022-02-26 IMAGING — CR DG TOE GREAT 2+V*R*
3 series · 3 of 3 positions shown · non-contrast
Comparison: None.

CLINICAL DATA: Fall, pain

EXAM:
RIGHT GREAT TOE

[toe ap]
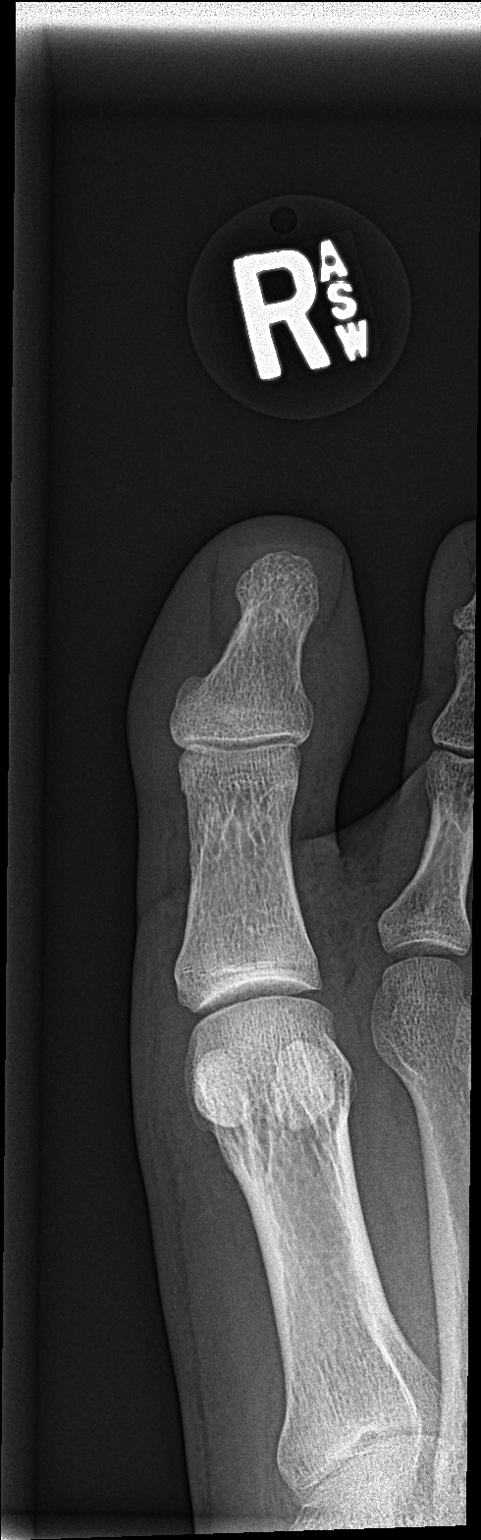

[toe obl]
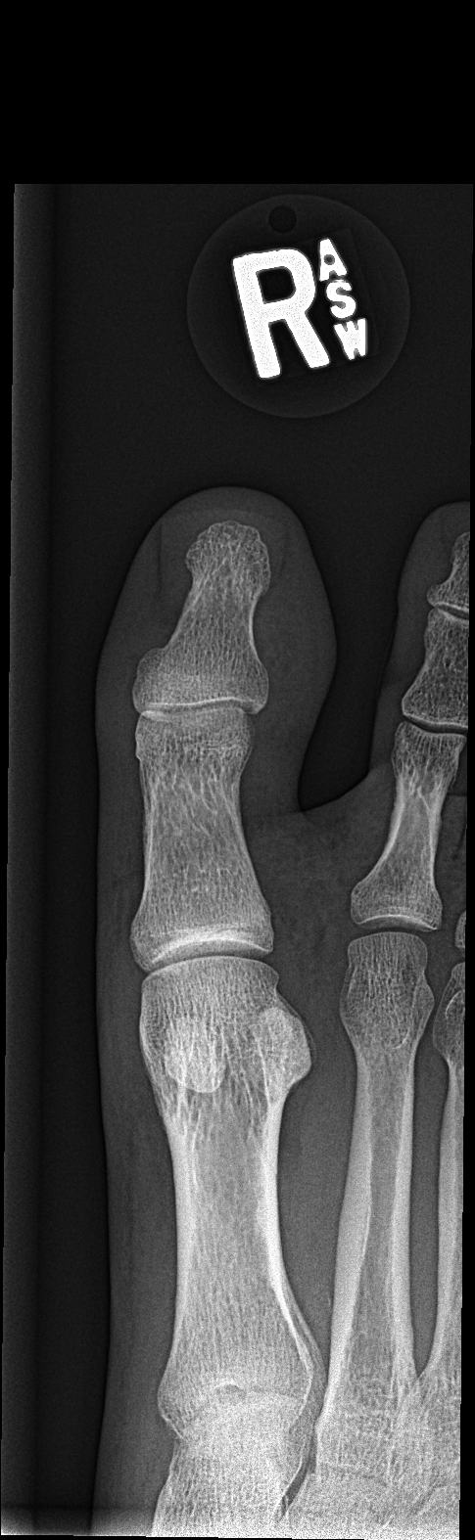

[toe lat]
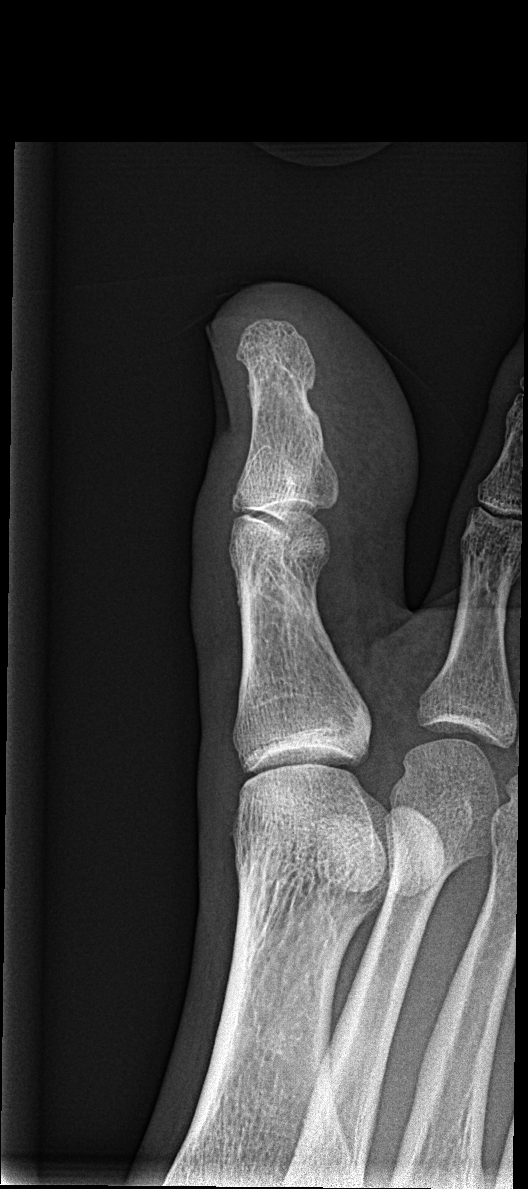

[3 of 3 positions shown; findings below may reference images not displayed]

FINDINGS: There is no evidence of fracture or dislocation. There is no
evidence of arthropathy or other focal bone abnormality. Soft tissue
edema about the digit.
IMPRESSION: No fracture or dislocation of the right great toe. Soft tissue
edema.

## 2022-03-01 ENCOUNTER — Encounter: Payer: Self-pay | Admitting: Urology

## 2022-03-01 ENCOUNTER — Ambulatory Visit: Payer: Commercial Managed Care - PPO | Admitting: Urology

## 2022-03-01 ENCOUNTER — Other Ambulatory Visit: Payer: Self-pay

## 2022-03-01 ENCOUNTER — Other Ambulatory Visit
Admission: RE | Admit: 2022-03-01 | Discharge: 2022-03-01 | Disposition: A | Discharge status: Home / Self Care | Payer: Commercial Managed Care - PPO | Attending: Urology | Admitting: Urology

## 2022-03-01 VITALS — BP 123/78 | HR 67 | Ht 70.5 in

## 2022-03-01 DIAGNOSIS — R350 Frequency of micturition: Secondary | ICD-10-CM

## 2022-03-01 DIAGNOSIS — M545 Low back pain, unspecified: Secondary | ICD-10-CM

## 2022-03-01 DIAGNOSIS — R102 Pelvic and perineal pain: Secondary | ICD-10-CM

## 2022-03-01 DIAGNOSIS — N5082 Scrotal pain: Secondary | ICD-10-CM | POA: Diagnosis not present

## 2022-03-01 LAB — URINALYSIS, COMPLETE (UACMP) WITH MICROSCOPIC
Bacteria, UA: NONE SEEN
Bilirubin Urine: NEGATIVE
Glucose, UA: NEGATIVE mg/dL
Hgb urine dipstick: NEGATIVE
Ketones, ur: NEGATIVE mg/dL
Leukocytes,Ua: NEGATIVE
Nitrite: NEGATIVE
Protein, ur: NEGATIVE mg/dL
Specific Gravity, Urine: 1.01 (ref 1.005–1.030)
Squamous Epithelial / HPF: NONE SEEN /HPF (ref 0–5)
WBC, UA: NONE SEEN WBC/hpf (ref 0–5)
pH: 6 (ref 5.0–8.0)

## 2022-03-01 MED ORDER — CELECOXIB 200 MG PO CAPS: 200.0000 mg | ORAL_CAPSULE | Freq: Two times a day (BID) | ORAL | 0 refills | Status: DC

## 2022-03-01 NOTE — Progress Notes (Signed)
   03/01/22 3:20 PM   Theresa Mulligan 1980/02/09 [id number]  CC: Urinary symptoms, pelvic pain, low back pain  HPI: I saw Mr. Reginald Yoder today for the above issues.  In the end of January he developed some urinary symptoms with some weak stream and urinary frequency/urgency, that part has improved but he still some persistent pelvic discomfort, scrotal pain, and low back pain.  Overall symptoms have improved about 60 or 70%.  Urine culture with PCP was normal, but he was treated with Keflex for possible UTI.  He did not report any improvement on the Keflex, but he did some hip stretching at the gym which significantly improved his symptoms.  Urinalysis today is benign.  Notably he had a similar episode 1 or 2 years ago that resolved spontaneously.   PMH: Past Medical History:  Diagnosis Date   Allergy    GERD (gastroesophageal reflux disease)     Surgical History: Past Surgical History:  Procedure Laterality Date   TONSILLECTOMY Bilateral 1987   VASECTOMY  2017   WISDOM TOOTH EXTRACTION Bilateral 2002    Family History: Family History  Problem Relation Age of Onset   Lung cancer Maternal Grandfather 59       smoker   Heart attack Maternal Grandfather    Prostate cancer Neg Hx    Colon cancer Neg Hx     Social History:  reports that he has quit smoking. He has quit using smokeless tobacco.  His smokeless tobacco use included chew. He reports current alcohol use of about 1.0 - 2.0 standard drink of alcohol per week. He reports that he does not currently use drugs after having used the following drugs: Marijuana.  Physical Exam: BP 123/78 (BP Location: Left Arm, Patient Position: Sitting, Cuff Size: Normal)   Pulse 67   Ht 5' 10.5" (1.791 m)   BMI 20.51 kg/m    Constitutional:  Alert and oriented, No acute distress. Cardiovascular: No clubbing, cyanosis, or edema. Respiratory: Normal respiratory effort, no increased work of breathing. GI: Abdomen is soft, nontender,  nondistended, no abdominal masses GU: Phallus with patent meatus, no lesions, testicles 20 cc and descended bilaterally without masses, nontender   Assessment & Plan:   42 year old male with likely episode of pelvic floor dysfunction that is improving after some self-directed stretching at home.  Urinalysis benign, exam benign, symptoms and history classic for pelvic floor dysfunction.  I recommended 10-day course of NSAIDs, and pelvic floor stretching exercises were provided.  Consider pelvic floor physical therapy referral if no improvement, or further imaging.  Celebrex 200 mg twice daily x 10 days, pelvic floor stretching exercises provided Return precautions discussed, follow-up with urology as needed   Nickolas Madrid, MD 03/01/2022  [facility] [address] [place], [place] [address] [phone]

## 2022-03-01 NOTE — Patient Instructions (Signed)
Pelvic Floor Dysfunction, Male     Pelvic floor dysfunction (PFD) is a condition that results when the group of muscles and connective tissues that support the organs in the pelvis (pelvic floor muscles) do not work well. These muscles and their connections form a sling that supports the colon and bladder. In men, these muscles also support the prostate gland. PFD causes pelvic floor muscles to be too weak, too tight, or both. In PFD, muscle movements are not coordinated. This may cause bowel or bladder problems. It may also cause pain. What are the causes? This condition may be caused by an injury to the pelvic area or by a weakening of pelvic muscles. In many cases, the exact cause is not known. What increases the risk? The following factors may make you more likely to develop PFD: Having chronic bladder tissue inflammation (interstitial cystitis). Being an older person. Being overweight. History of radiation treatment for cancer in the pelvic region. Previous pelvic surgery, such as removal of the prostate gland (prostatectomy). What are the signs or symptoms? Symptoms of this condition vary and may include: Bladder symptoms, such as: Trouble starting urination and emptying the bladder. Frequent urinary tract infections. Leaking urine when coughing, laughing, or exercising (stress incontinence). Having to pass urine urgently or frequently. Pain when passing urine. Bowel symptoms, such as: Constipation. Urgent or frequent bowel movements. Incomplete bowel movements. Painful bowel movements. Leaking stool or gas. Unexplained genital or rectal pain. Genital or rectal muscle spasms. Low back pain. Sexual dysfunction, such as erectile dysfunction, premature ejaculation, or pain during or after sexual activity. How is this diagnosed? This condition is diagnosed based on: Your symptoms and medical history. A physical exam. During the exam, your health care provider may check your  pelvic muscles for tightness, spasm, pain, or weakness. This may include a rectal exam. In some cases, you may have diagnostic tests, such as: Electrical muscle function tests. Urine flow testing. X-ray tests of bowel function. Ultrasound of the pelvic organs. How is this treated? Treatment for this condition depends on your symptoms. Treatment options include: Physical therapy. This may include Kegel exercises to help relax or strengthen the pelvic floor muscles. Massage therapy. A treatment that involves electrical stimulation of the pelvic floor muscles to help control pain (transcutaneous electrical nerve stimulation, or TENS). Sound wave therapy (ultrasound) to reduce muscle spasms. Medicines, such as: Muscle relaxants. Bladder control medicines. Surgery to reconstruct or support pelvic floor muscles may be an option if other treatments do not help. Follow these instructions at home: Activity Do your usual activities as told by your health care provider. Ask your health care provider if you should modify any activities. Do pelvic floor strengthening or relaxing exercises at home as told by your physical therapist. Lifestyle Maintain a healthy weight. Eat foods that are high in fiber, such as beans, whole grains, and fresh fruits and vegetables. Limit foods that are high in fat and processed sugars, such as fried or sweet foods. Manage stress with relaxation techniques such as yoga or meditation. General instructions If you have problems with leakage: Use absorbable pads or wear padded underwear. Wash your genital and anal area frequently with mild soap. Keep your genital and anal area as clean and dry as possible. Ask your health care provider if you should try a barrier cream to prevent skin irritation. Take warm baths to relieve pelvic muscle tension or spasms. Take over-the-counter and prescription medicines only as told by your health care provider. Keep all follow-up  visits. How is this prevented? The cause of PFD is not always known, but there are a few things you can do to reduce the risk of developing this condition, including: Staying at a healthy weight. Getting regular exercise. Managing stress. Contact a health care provider if: Your symptoms are not improving with home care. You have signs or symptoms of PFD that get worse. You develop new signs or symptoms. You have signs of a urinary tract infection, such as: Fever. Chills. Increased urinary frequency. A burning feeling when urinating. You have not had a bowel movement in 3 days (constipation). Summary Pelvic floor dysfunction results when the muscles and connective tissues in your pelvic floor do not work well. These muscles and their connections form a sling that supports your colon and bladder. In men, these muscles also support the prostate gland. PFD may be caused by an injury to the pelvic area or by a weakening of pelvic muscles. PFD causes pelvic floor muscles to be too weak, too tight, or a combination of both. Symptoms may vary from person to person. In most cases, PFD can be treated with physical therapies and medicines. Surgery may be an option if other treatments do not help. This information is not intended to replace advice given to you by your health care provider. Make sure you discuss any questions you have with your health care provider. Document Revised: 05/20/2020 Document Reviewed: 05/20/2020 Elsevier Patient Education  [place].

## 2022-03-07 ENCOUNTER — Ambulatory Visit: Payer: Commercial Managed Care - PPO | Admitting: Urology

## 2022-06-15 ENCOUNTER — Other Ambulatory Visit: Payer: Self-pay | Admitting: Family Medicine

## 2022-06-15 DIAGNOSIS — K219 Gastro-esophageal reflux disease without esophagitis: Secondary | ICD-10-CM

## 2022-06-15 NOTE — Telephone Encounter (Signed)
Requested Prescriptions  Pending Prescriptions Disp Refills   esomeprazole (NEXIUM) 40 MG capsule [Pharmacy Med Name: ESOMEPRA MAG CAP 40MG  DR] 90 capsule 2    Sig: TAKE 1 CAPSULE DAILY BEFOREBREAKFAST     Gastroenterology: Proton Pump Inhibitors 2 Passed - 06/15/2022  2:14 AM      Passed - ALT in normal range and within 360 days    ALT  Date Value Ref Range Status  08/30/2021 24 9 - 46 U/L Final         Passed - AST in normal range and within 360 days    AST  Date Value Ref Range Status  08/30/2021 23 10 - 40 U/L Final         Passed - Valid encounter within last 12 months    Recent Outpatient Visits           4 months ago Acute cystitis with hematuria   [place] [facility] [place], Netta Neat, DO   6 months ago Acute non-recurrent frontal sinusitis   [place] [facility] Smitty Cords, DO   9 months ago Annual physical exam   [place] [facility] Smitty Cords, DO   11 months ago Tinnitus, right ear   [place] [facility] Smitty Cords, DO   1 year ago Right inguinal hernia   [place] [facility] Smitty Cords, DO       Future Appointments             In 2 months Althea Charon, Netta Neat, DO  [place] [facility]

## 2022-09-02 ENCOUNTER — Encounter: Payer: Commercial Managed Care - PPO | Admitting: Family Medicine

## 2022-09-19 ENCOUNTER — Encounter: Payer: Commercial Managed Care - PPO | Admitting: Family Medicine

## 2023-01-17 ENCOUNTER — Other Ambulatory Visit: Payer: Self-pay | Admitting: Family Medicine

## 2023-01-17 DIAGNOSIS — K219 Gastro-esophageal reflux disease without esophagitis: Secondary | ICD-10-CM

## 2023-01-17 NOTE — Telephone Encounter (Signed)
Requested medication (s) are due for refill today - yes  Requested medication (s) are on the active medication list -yes  Future visit scheduled -no  Last refill: 06/15/22 #90 1RF  Notes to clinic: fails lab protocol- over 1 year-08/30/21  Requested Prescriptions  Pending Prescriptions Disp Refills   esomeprazole (NEXIUM) 40 MG capsule [Pharmacy Med Name: ESOMEPRA MAG CAP 40MG  DR] 90 capsule 1    Sig: TAKE 1 CAPSULE DAILY BEFOREBREAKFAST     Gastroenterology: Proton Pump Inhibitors 2 Failed - 01/17/2023  3:11 PM      Failed - ALT in normal range and within 360 days    ALT  Date Value Ref Range Status  08/30/2021 24 9 - 46 U/L Final         Failed - AST in normal range and within 360 days    AST  Date Value Ref Range Status  08/30/2021 23 10 - 40 U/L Final         Passed - Valid encounter within last 12 months    Recent Outpatient Visits           11 months ago Acute cystitis with hematuria   [place] [facility] Smitty Cords, DO   1 year ago Acute non-recurrent frontal sinusitis   [place] [facility] [place], Netta Neat, DO   1 year ago Annual physical exam   [facility] Smitty Cords, DO   1 year ago Tinnitus, right ear   [place] [facility] Smitty Cords, DO   2 years ago Right inguinal hernia   [place] [facility] Smitty Cords, DO                 Requested Prescriptions  Pending Prescriptions Disp Refills   esomeprazole (NEXIUM) 40 MG capsule [Pharmacy Med Name: ESOMEPRA MAG CAP 40MG  DR] 90 capsule 1    Sig: TAKE 1 CAPSULE DAILY BEFOREBREAKFAST     Gastroenterology: Proton Pump Inhibitors 2 Failed - 01/17/2023  3:11 PM      Failed - ALT in normal range and within 360 days    ALT  Date Value Ref Range Status  08/30/2021 24 9 - 46 U/L Final         Failed - AST in normal range and within 360 days     AST  Date Value Ref Range Status  08/30/2021 23 10 - 40 U/L Final         Passed - Valid encounter within last 12 months    Recent Outpatient Visits           11 months ago Acute cystitis with hematuria   [place] [facility] Smitty Cords, DO   1 year ago Acute non-recurrent frontal sinusitis   [place] [facility] Smitty Cords, DO   1 year ago Annual physical exam   [place] [facility] Smitty Cords, DO   1 year ago Tinnitus, right ear   [place] [facility] Smitty Cords, DO   2 years ago Right inguinal hernia    [facility], Netta Neat, [place]

## 2023-03-30 ENCOUNTER — Encounter: Payer: Self-pay | Admitting: Family Medicine

## 2023-03-30 ENCOUNTER — Ambulatory Visit (INDEPENDENT_AMBULATORY_CARE_PROVIDER_SITE_OTHER): Payer: Commercial Managed Care - PPO | Admitting: Family Medicine

## 2023-03-30 VITALS — BP 110/76 | HR 63 | Ht 70.5 in | Wt 173.0 lb

## 2023-03-30 DIAGNOSIS — Z0000 Encounter for general adult medical examination without abnormal findings: Secondary | ICD-10-CM

## 2023-03-30 DIAGNOSIS — Z131 Encounter for screening for diabetes mellitus: Secondary | ICD-10-CM

## 2023-03-30 DIAGNOSIS — Z125 Encounter for screening for malignant neoplasm of prostate: Secondary | ICD-10-CM

## 2023-03-30 DIAGNOSIS — E7800 Pure hypercholesterolemia, unspecified: Secondary | ICD-10-CM

## 2023-03-30 DIAGNOSIS — K219 Gastro-esophageal reflux disease without esophagitis: Secondary | ICD-10-CM

## 2023-03-30 MED ORDER — ESOMEPRAZOLE MAGNESIUM 40 MG PO CPDR: 40.0000 mg | DELAYED_RELEASE_CAPSULE | Freq: Every day | ORAL | 3 refills | Status: AC

## 2023-03-30 NOTE — Progress Notes (Signed)
 Subjective:    Patient ID: Reginald Yoder, male    DOB: Nov 26, 1980, 43 y.o.   MRN: [id number]  Reginald Yoder is a 43 y.o. male presenting on 03/30/2023 for Annual Exam   HPI  Discussed the use of AI scribe software for clinical note transcription with the patient, who gave verbal consent to proceed.  History of Present Illness   Reginald Yoder "Nate" is a 43 year old male who presents for an annual physical exam.      Here for Annual Physical and Lab Orders   GERD He is currently taking omeprazole 40 mg daily before breakfast for acid reflux and would like to continue receiving a three-month supply through mail order. He has switched his pharmacy to [facility].   History of Sinusitis / Allergies Improved on Flonase.   Lifestyle   Active walking 10k steps per day, gym, cardio, hiking, strength training. Sports   Urinary Frequency / Pelvic Pain / BPH LUTS Prior evaluation in 02/2022, had seen Dr [facility], initially improved w/ pelvic floor therapy and stretches. He improved, and now 1 year later has had a recurrence. Not having frequent urination. Not having pain or discomfort. No alcohol. No caffeine.  He has a sensation of incomplete bladder emptying and some dripping at the end of urination, but no pain or discomfort. He manages these symptoms with pelvic floor exercises, which were previously effective.    Health Maintenance: No updated COVID19 vaccine   Tdap 2018-2019, review record.   Declines Hep C     03/30/2023    8:23 AM 02/15/2022    3:54 PM 08/30/2021    8:46 AM  Depression screen PHQ 2/9  Decreased Interest 0 0 0  Down, Depressed, Hopeless 0 0 0  PHQ - 2 Score 0 0 0  Altered sleeping 0 0 0  Tired, decreased energy 0 0 1  Change in appetite 0 0 0  Feeling bad or failure about yourself  0 0 0  Trouble concentrating 0 0 0  Moving slowly or fidgety/restless 0 0 0  Suicidal thoughts 0 0 0  PHQ-9 Score 0 0 1  Difficult doing work/chores   Not difficult at all Not difficult at all       03/30/2023    8:23 AM 02/15/2022    3:54 PM 08/30/2021    8:47 AM 07/14/2021    2:18 PM  GAD 7 : Generalized Anxiety Score  Nervous, Anxious, on Edge 0 0 0 0  Control/stop worrying 0 0 0 0  Worry too much - different things 0 0 0 0  Trouble relaxing 0 0 0 0  Restless 0 0 0 0  Easily annoyed or irritable 0 0 0 0  Afraid - awful might happen 0 0 0 0  Total GAD 7 Score 0 0 0 0  Anxiety Difficulty  Not difficult at all Not difficult at all Not difficult at all     Past Medical History:  Diagnosis Date   Allergy    GERD (gastroesophageal reflux disease)    Past Surgical History:  Procedure Laterality Date   TONSILLECTOMY Bilateral 1987   VASECTOMY  2017   WISDOM TOOTH EXTRACTION Bilateral 2002   Social History   Socioeconomic History   Marital status: Married    Spouse name: Not on file   Number of children: Not on file   Years of education: College   Highest education level: Bachelor's degree (e.g., BA, AB, BS)  Occupational History  Not on file  Tobacco Use   Smoking status: Former   Smokeless tobacco: Former    Types: Chew   Tobacco comments:    chewing tobacco for 1 year  Vaping Use   Vaping status: Never Used  Substance and Sexual Activity   Alcohol use: Yes    Alcohol/week: 1.0 - 2.0 standard drink of alcohol    Types: 1 - 2 Standard drinks or equivalent per week    Comment: rare   Drug use: Not Currently    Types: Marijuana   Sexual activity: Not on file  Other Topics Concern   Not on file  Social History Narrative   Not on file   Social Drivers of Health   Financial Resource Strain: Low Risk  (03/29/2023)   Overall Financial Resource Strain (CARDIA)    Difficulty of Paying Living Expenses: Not very hard  Food Insecurity: No Food Insecurity (03/29/2023)   Hunger Vital Sign    Worried About Running Out of Food in the Last Year: Never true    Ran Out of Food in the Last Year: Never true  Transportation  Needs: No Transportation Needs (03/29/2023)   PRAPARE - Administrator, Civil Service (Medical): No    Lack of Transportation (Non-Medical): No  Physical Activity: Sufficiently Active (03/29/2023)   Exercise Vital Sign    Days of Exercise per Week: 5 days    Minutes of Exercise per Session: 30 min  Stress: No Stress Concern Present (03/29/2023)   [employer] of Occupational Health - Occupational Stress Questionnaire    Feeling of Stress : Not at all  Social Connections: Socially Integrated (03/29/2023)   Social Connection and Isolation Panel [NHANES]    Frequency of Communication with Friends and Family: Twice a week    Frequency of Social Gatherings with Friends and Family: Once a week    Attends Religious Services: More than 4 times per year    Active Member of [employer] or Organizations: Yes    Attends Engineer, structural: More than 4 times per year    Marital Status: Married  Catering manager Violence: Not on file   Family History  Problem Relation Age of Onset   Lung cancer Maternal Grandfather 55       smoker   Heart attack Maternal Grandfather    Prostate cancer Neg Hx    Colon cancer Neg Hx    Current Outpatient Medications on File Prior to Visit  Medication Sig   cetirizine (ZYRTEC) 10 MG tablet Take 10 mg by mouth every morning.   fluticasone (FLONASE) 50 MCG/ACT nasal spray Place 2 sprays into both nostrils daily. Use for 4-6 weeks then stop and use seasonally or as needed.   No current facility-administered medications on file prior to visit.    Review of Systems  Constitutional:  Negative for activity change, appetite change, chills, diaphoresis, fatigue and fever.  HENT:  Negative for congestion and hearing loss.   Eyes:  Negative for visual disturbance.  Respiratory:  Negative for cough, chest tightness, shortness of breath and wheezing.   Cardiovascular:  Negative for chest pain, palpitations and leg swelling.  Gastrointestinal:  Negative for  abdominal pain, constipation, diarrhea, nausea and vomiting.  Genitourinary:  Negative for dysuria, frequency and hematuria.  Musculoskeletal:  Negative for arthralgias and neck pain.  Skin:  Negative for rash.  Neurological:  Negative for dizziness, weakness, light-headedness, numbness and headaches.  Hematological:  Negative for adenopathy.  Psychiatric/Behavioral:  Negative for behavioral  problems, dysphoric mood and sleep disturbance.    Per HPI unless specifically indicated above     Objective:    BP 110/76   Pulse 63   Ht 5' 10.5" (1.791 m)   Wt 173 lb (78.5 kg)   SpO2 99%   BMI 24.47 kg/m   Wt Readings from Last 3 Encounters:  03/30/23 173 lb (78.5 kg)  02/15/22 145 lb (65.8 kg)  11/18/21 173 lb (78.5 kg)    Physical Exam Vitals and nursing note reviewed.  Constitutional:      General: He is not in acute distress.    Appearance: He is well-developed. He is not diaphoretic.     Comments: Well-appearing, comfortable, cooperative  HENT:     Head: Normocephalic and atraumatic.     Right Ear: Tympanic membrane, ear canal and external ear normal. There is no impacted cerumen.     Left Ear: Tympanic membrane, ear canal and external ear normal. There is no impacted cerumen.  Eyes:     General:        Right eye: No discharge.        Left eye: No discharge.     Conjunctiva/sclera: Conjunctivae normal.     Pupils: Pupils are equal, round, and reactive to light.  Neck:     Thyroid: No thyromegaly.     Vascular: No carotid bruit.  Cardiovascular:     Rate and Rhythm: Normal rate and regular rhythm.     Pulses: Normal pulses.     Heart sounds: Normal heart sounds. No murmur heard. Pulmonary:     Effort: Pulmonary effort is normal. No respiratory distress.     Breath sounds: Normal breath sounds. No wheezing or rales.  Abdominal:     General: Bowel sounds are normal. There is no distension.     Palpations: Abdomen is soft. There is no mass.     Tenderness: There is no  abdominal tenderness.  Musculoskeletal:        General: No tenderness. Normal range of motion.     Cervical back: Normal range of motion and neck supple.     Right lower leg: No edema.     Left lower leg: No edema.     Comments: Upper / Lower Extremities: - Normal muscle tone, strength bilateral upper extremities 5/5, lower extremities 5/5  Lymphadenopathy:     Cervical: No cervical adenopathy.  Skin:    General: Skin is warm and dry.     Findings: No erythema or rash.  Neurological:     Mental Status: He is alert and oriented to person, place, and time.     Comments: Distal sensation intact to light touch all extremities  Psychiatric:        Mood and Affect: Mood normal.        Behavior: Behavior normal.        Thought Content: Thought content normal.     Comments: Well groomed, good eye contact, normal speech and thoughts     Results for orders placed or performed during the hospital encounter of 03/01/22  Urinalysis, Complete w Microscopic -   Collection Time: 03/01/22  2:24 PM  Result Value Ref Range   Color, Urine YELLOW YELLOW   APPearance CLEAR CLEAR   Specific Gravity, Urine 1.010 1.005 - 1.030   pH 6.0 5.0 - 8.0   Glucose, UA NEGATIVE NEGATIVE mg/dL   Hgb urine dipstick NEGATIVE NEGATIVE   Bilirubin Urine NEGATIVE NEGATIVE   Ketones, ur NEGATIVE NEGATIVE mg/dL  Protein, ur NEGATIVE NEGATIVE mg/dL   Nitrite NEGATIVE NEGATIVE   Leukocytes,Ua NEGATIVE NEGATIVE   Squamous Epithelial / HPF NONE SEEN 0 - 5 /HPF   WBC, UA NONE SEEN 0 - 5 WBC/hpf   RBC / HPF 0-5 0 - 5 RBC/hpf   Bacteria, UA NONE SEEN NONE SEEN      Assessment & Plan:   Problem List Items Addressed This Visit     Gastroesophageal reflux disease   Relevant Medications   esomeprazole (NEXIUM) 40 MG capsule   Other Visit Diagnoses       Annual physical exam    -  Primary   Relevant Orders   Lipid panel   Hemoglobin A1c   COMPLETE METABOLIC PANEL WITH GFR   CBC with Differential/Platelet    TSH     Elevated LDL cholesterol level       Relevant Orders   Lipid panel   COMPLETE METABOLIC PANEL WITH GFR   TSH     Screening for prostate cancer       Relevant Orders   PSA     Screening for diabetes mellitus       Relevant Orders   Hemoglobin A1c        Updated Health Maintenance information Fasting lab orders, pending results Encouraged improvement to lifestyle with diet and exercise Goal of weight loss   Pelvic Floor Dysfunction vs BPH LUTS Recurrent symptoms with incomplete bladder emptying and post-void dribbling. Differential includes mild enlarged prostate or bladder irritants. - Continue pelvic floor exercises. - Implement double voiding technique. - Consider saw palmetto supplement if symptoms persist. - Follow up with Urologist if no improvement.  General Health Maintenance Discussed colon cancer screening options for age 43. Agreed to PSA test for prostate cancer screening. - Perform blood tests: thyroid, PSA, blood count, chemistry, kidney, liver, glucose, cholesterol. - Continue omeprazole with 90-day supply via [facility].  Follow-up Routine follow-up for annual physical and lab results. - Schedule annual physical examination. - Follow up on lab results once available. - Update via MyChart regarding urinary symptoms in a few weeks.         Orders Placed This Encounter  Procedures   Lipid panel    Has the patient fasted?:   Yes   Hemoglobin A1c   COMPLETE METABOLIC PANEL WITH GFR   CBC with Differential/Platelet   PSA   TSH    Meds ordered this encounter  Medications   esomeprazole (NEXIUM) 40 MG capsule    Sig: Take 1 capsule (40 mg total) by mouth daily before breakfast.    Dispense:  90 capsule    Refill:  3     Follow up plan: Return in about 1 year (around 03/29/2024) for 1 year Annual Physical fasting lab AFTER.  Saralyn Pilar, DO [facility] 03/30/2023, 8:53 AM

## 2023-03-30 NOTE — Patient Instructions (Addendum)
 Thank you for coming to the office today.  Labs today.  For urinary symptoms  1st try the exercises for pelvic floor  Also consider double voiding  If still not improved May be mild enlarged prostate  Try herbal supplement - Saw Palmetto 80-160mg  per dose twice a day, for prostate health and urinary symptoms   Please schedule a Follow-up Appointment to: Return in about 1 year (around 03/29/2024) for 1 year Annual Physical fasting lab AFTER.  If you have any other questions or concerns, please feel free to call the office or send a message through MyChart. You may also schedule an earlier appointment if necessary.  Additionally, you may be receiving a survey about your experience at our office within a few days to 1 week by e-mail or mail. We value your feedback.  Saralyn Pilar, DO [facility], [place]

## 2023-03-31 ENCOUNTER — Encounter: Payer: Self-pay | Admitting: Family Medicine

## 2023-03-31 LAB — CBC WITH DIFFERENTIAL/PLATELET
Absolute Lymphocytes: 1426 {cells}/uL (ref 850–3900)
Absolute Monocytes: 535 {cells}/uL (ref 200–950)
Basophils Absolute: 38 {cells}/uL (ref 0–200)
Basophils Relative: 0.7 %
Eosinophils Absolute: 108 {cells}/uL (ref 15–500)
Eosinophils Relative: 2 %
HCT: 43.6 % (ref 38.5–50.0)
Hemoglobin: 14.8 g/dL (ref 13.2–17.1)
MCH: 31 pg (ref 27.0–33.0)
MCHC: 33.9 g/dL (ref 32.0–36.0)
MCV: 91.4 fL (ref 80.0–100.0)
MPV: 11 fL (ref 7.5–12.5)
Monocytes Relative: 9.9 %
Neutro Abs: 3294 {cells}/uL (ref 1500–7800)
Neutrophils Relative %: 61 %
Platelets: 284 10*3/uL (ref 140–400)
RBC: 4.77 10*6/uL (ref 4.20–5.80)
RDW: 12.4 % (ref 11.0–15.0)
Total Lymphocyte: 26.4 %
WBC: 5.4 10*3/uL (ref 3.8–10.8)

## 2023-03-31 LAB — COMPLETE METABOLIC PANEL WITH GFR
AG Ratio: 1.9 (calc) (ref 1.0–2.5)
ALT: 38 U/L (ref 9–46)
AST: 32 U/L (ref 10–40)
Albumin: 4.6 g/dL (ref 3.6–5.1)
Alkaline phosphatase (APISO): 56 U/L (ref 36–130)
BUN/Creatinine Ratio: 25 (calc) — ABNORMAL HIGH (ref 6–22)
BUN: 26 mg/dL — ABNORMAL HIGH (ref 7–25)
CO2: 27 mmol/L (ref 20–32)
Calcium: 10 mg/dL (ref 8.6–10.3)
Chloride: 104 mmol/L (ref 98–110)
Creat: 1.03 mg/dL (ref 0.60–1.29)
Globulin: 2.4 g/dL (ref 1.9–3.7)
Glucose, Bld: 84 mg/dL (ref 65–99)
Potassium: 4.5 mmol/L (ref 3.5–5.3)
Sodium: 138 mmol/L (ref 135–146)
Total Bilirubin: 0.6 mg/dL (ref 0.2–1.2)
Total Protein: 7 g/dL (ref 6.1–8.1)
eGFR: 94 mL/min/{1.73_m2} (ref >=60)

## 2023-03-31 LAB — LIPID PANEL
Cholesterol: 183 mg/dL (ref <200)
HDL: 51 mg/dL (ref >=40)
LDL Cholesterol (Calc): 115 mg/dL — ABNORMAL HIGH
Non-HDL Cholesterol (Calc): 132 mg/dL — ABNORMAL HIGH (ref <130)
Total CHOL/HDL Ratio: 3.6 (calc) (ref <5.0)
Triglycerides: 71 mg/dL (ref <150)

## 2023-03-31 LAB — HEMOGLOBIN A1C
Hgb A1c MFr Bld: 5.4 %{Hb} (ref <5.7)
Mean Plasma Glucose: 108 mg/dL
eAG (mmol/L): 6 mmol/L

## 2023-03-31 LAB — PSA: PSA: 0.76 ng/mL (ref <=4.00)

## 2023-11-20 ENCOUNTER — Ambulatory Visit: Admitting: Family Medicine

## 2023-11-20 VITALS — BP 112/70 | HR 66 | Ht 70.5 in | Wt 183.2 lb

## 2023-11-20 DIAGNOSIS — M7542 Impingement syndrome of left shoulder: Secondary | ICD-10-CM | POA: Diagnosis not present

## 2023-11-20 DIAGNOSIS — M25512 Pain in left shoulder: Secondary | ICD-10-CM | POA: Diagnosis not present

## 2023-11-20 DIAGNOSIS — G5622 Lesion of ulnar nerve, left upper limb: Secondary | ICD-10-CM

## 2023-11-20 DIAGNOSIS — G8929 Other chronic pain: Secondary | ICD-10-CM | POA: Diagnosis not present

## 2023-11-20 MED ORDER — NAPROXEN 500 MG PO TABS: 500.0000 mg | ORAL_TABLET | Freq: Two times a day (BID) | ORAL | 0 refills | Status: AC

## 2023-11-20 NOTE — Progress Notes (Signed)
 Subjective:    Patient ID: Reginald Yoder, male    DOB: [birth year], 43 y.o.   MRN: [id number]  Reginald Yoder is a 43 y.o. male presenting on 11/20/2023 for Neck Pain (About a month)  Patient presents for a same day appointment.  HPI  Discussed the use of AI scribe software for clinical note transcription with the patient, who gave verbal consent to proceed.  History of Present Illness   Reginald Yoder is a 43 year old male who presents with left shoulder pain, numbness, and tingling in the left hand.  Left shoulder pain and paresthesia (ulnar distribution) - Left shoulder pain, numbness, and tingling in the left hand for approximately one month - Onset after exercise, particularly with pressing movements such as overhead or up and out motions - Initial symptoms included soreness and a sense of abnormality without loss of strength - Symptoms have progressively worsened despite cessation of aggravating activities - Pain localized to the lateral aspect of the left shoulder - Tightness present in the neck - Pain exacerbated by internal rotation (e.g., putting on a jacket, reaching back) and overhead movements - Numbness and tingling are intermittent, primarily affecting the fourth and fifth digits of the left hand  Prior shoulder injury - History of right shoulder AC joint injury sustained during a football game as a teenager - No prior injuries to the left shoulder  Symptom management and functional impact - Managing symptoms with ibuprofen  200 mg three times daily, providing some relief - Performing an exercise involving lying on the floor with a pillow under the arm, which improves range of motion - Desires to return to exercise; previously active with walking at work and lifting at home prior to symptom onset         03/30/2023    8:23 AM 02/15/2022    3:54 PM 08/30/2021    8:46 AM  Depression screen PHQ 2/9  Decreased Interest 0 0 0  Down, Depressed, Hopeless 0 0  0  PHQ - 2 Score 0 0 0  Altered sleeping 0 0 0  Tired, decreased energy 0 0 1  Change in appetite 0 0 0  Feeling bad or failure about yourself  0 0 0  Trouble concentrating 0 0 0  Moving slowly or fidgety/restless 0 0 0  Suicidal thoughts 0 0 0  PHQ-9 Score 0 0 1  Difficult doing work/chores  Not difficult at all Not difficult at all       03/30/2023    8:23 AM 02/15/2022    3:54 PM 08/30/2021    8:47 AM 07/14/2021    2:18 PM  GAD 7 : Generalized Anxiety Score  Nervous, Anxious, on Edge 0 0 0 0  Control/stop worrying 0 0 0 0  Worry too much - different things 0 0 0 0  Trouble relaxing 0 0 0 0  Restless 0 0 0 0  Easily annoyed or irritable 0 0 0 0  Afraid - awful might happen 0 0 0 0  Total GAD 7 Score 0 0 0 0  Anxiety Difficulty  Not difficult at all Not difficult at all Not difficult at all    Social History   Tobacco Use   Smoking status: Former   Smokeless tobacco: Former    Types: Chew   Tobacco comments:    chewing tobacco for 1 year  Vaping Use   Vaping status: Never Used  Substance Use Topics   Alcohol use: Yes    Alcohol/week:  1.0 - 2.0 standard drink of alcohol    Types: 1 - 2 Standard drinks or equivalent per week    Comment: rare   Drug use: Not Currently    Types: Marijuana    Review of Systems Per HPI unless specifically indicated above     Objective:    BP 112/70 (BP Location: Right Arm, Patient Position: Sitting, Cuff Size: Normal)   Pulse 66   Ht 5' 10.5 (1.791 m)   Wt 183 lb 4 oz (83.1 kg)   SpO2 95%   BMI 25.92 kg/m   Wt Readings from Last 3 Encounters:  11/20/23 183 lb 4 oz (83.1 kg)  03/30/23 173 lb (78.5 kg)  02/15/22 145 lb (65.8 kg)    Physical Exam Vitals and nursing note reviewed.  Constitutional:      General: He is not in acute distress.    Appearance: Normal appearance. He is well-developed. He is not diaphoretic.     Comments: Well-appearing, comfortable, cooperative  HENT:     Head: Normocephalic and atraumatic.   Eyes:     General:        Right eye: No discharge.        Left eye: No discharge.     Conjunctiva/sclera: Conjunctivae normal.  Cardiovascular:     Rate and Rhythm: Normal rate.  Pulmonary:     Effort: Pulmonary effort is normal.  Musculoskeletal:     Comments: LEFT Shoulder Inspection: Normal appearance bilateral symmetrical Palpation: Non-tender to palpation over anterior, lateral, or posterior shoulder  ROM: Mostly full intact active ROM forward flexion, abduction, internal / external rotation, symmetrical Special Testing: Rotator cuff testing negative for weakness with supraspinatus full can but positive pain on empty can test. Positive impingement for pain Strength: Normal strength 5/5 flex/ext, ext rot / int rot, grip, rotator cuff str testing. Neurovascular: Distally intact pulses, sensation to light touch   Skin:    General: Skin is warm and dry.     Findings: No erythema or rash.  Neurological:     Mental Status: He is alert and oriented to person, place, and time.  Psychiatric:        Mood and Affect: Mood normal.        Behavior: Behavior normal.        Thought Content: Thought content normal.     Comments: Well groomed, good eye contact, normal speech and thoughts     Results for orders placed or performed in visit on 03/30/23  Lipid panel   Collection Time: 03/30/23  9:16 AM  Result Value Ref Range   Cholesterol 183 <200 mg/dL   HDL 51 > OR = 40 mg/dL   Triglycerides 71 <849 mg/dL   LDL Cholesterol (Calc) 115 (H) mg/dL (calc)   Total CHOL/HDL Ratio 3.6 <5.0 (calc)   Non-HDL Cholesterol (Calc) 132 (H) <130 mg/dL (calc)  Hemoglobin J8r   Collection Time: 03/30/23  9:16 AM  Result Value Ref Range   Hgb A1c MFr Bld 5.4 <5.7 % of total Hgb   Mean Plasma Glucose 108 mg/dL   eAG (mmol/L) 6.0 mmol/L  COMPLETE METABOLIC PANEL WITH GFR   Collection Time: 03/30/23  9:16 AM  Result Value Ref Range   Glucose, Bld 84 65 - 99 mg/dL   BUN 26 (H) 7 - 25 mg/dL   Creat  8.96 9.39 - 8.70 mg/dL   eGFR 94 > OR = 60 fO/fpw/8.26f7   BUN/Creatinine Ratio 25 (H) 6 - 22 (calc)   Sodium  138 135 - 146 mmol/L   Potassium 4.5 3.5 - 5.3 mmol/L   Chloride 104 98 - 110 mmol/L   CO2 27 20 - 32 mmol/L   Calcium 10.0 8.6 - 10.3 mg/dL   Total Protein 7.0 6.1 - 8.1 g/dL   Albumin 4.6 3.6 - 5.1 g/dL   Globulin 2.4 1.9 - 3.7 g/dL (calc)   AG Ratio 1.9 1.0 - 2.5 (calc)   Total Bilirubin 0.6 0.2 - 1.2 mg/dL   Alkaline phosphatase (APISO) 56 36 - 130 U/L   AST 32 10 - 40 U/L   ALT 38 9 - 46 U/L  CBC with Differential/Platelet   Collection Time: 03/30/23  9:16 AM  Result Value Ref Range   WBC 5.4 3.8 - 10.8 Thousand/uL   RBC 4.77 4.20 - 5.80 Million/uL   Hemoglobin 14.8 13.2 - 17.1 g/dL   HCT 56.3 61.4 - 49.9 %   MCV 91.4 80.0 - 100.0 fL   MCH 31.0 27.0 - 33.0 pg   MCHC 33.9 32.0 - 36.0 g/dL   RDW 87.5 88.9 - 84.9 %   Platelets 284 140 - 400 Thousand/uL   MPV 11.0 7.5 - 12.5 fL   Neutro Abs 3,294 1,500 - 7,800 cells/uL   Absolute Lymphocytes 1,426 850 - 3,900 cells/uL   Absolute Monocytes 535 200 - 950 cells/uL   Eosinophils Absolute 108 15 - 500 cells/uL   Basophils Absolute 38 0 - 200 cells/uL   Neutrophils Relative % 61 %   Total Lymphocyte 26.4 %   Monocytes Relative 9.9 %   Eosinophils Relative 2.0 %   Basophils Relative 0.7 %  PSA   Collection Time: 03/30/23  9:16 AM  Result Value Ref Range   PSA 0.76 < OR = 4.00 ng/mL      Assessment & Plan:   Problem List Items Addressed This Visit   None Visit Diagnoses       Rotator cuff impingement syndrome of left shoulder    -  Primary   Relevant Medications   naproxen (NAPROSYN) 500 MG tablet   Other Relevant Orders   Ambulatory referral to Physical Therapy     Chronic left shoulder pain       Relevant Medications   naproxen (NAPROSYN) 500 MG tablet   Other Relevant Orders   Ambulatory referral to Physical Therapy     Entrapment of left ulnar nerve            Left Shoulder Impingement  Syndrome Left shoulder pain with numbness and tingling in the left hand Ulnar nerve entrapment, exacerbated by overhead activities. Suspected rotator cuff involvement. Clinical presentation consistent with impingement syndrome. Imaging not required today. Expected improvement with rest and therapy.  - Refer to physical therapy at [facility] in [place]. - Prescribe naproxen 500 mg twice daily with food, avoid ibuprofen . - short course 1-2 weeks, risks/benefits of NSAID - Advise on range of motion exercises and provide additional exercise recommendations. - Discuss potential for imaging or orthopedic referral if no improvement.       Orders Placed This Encounter  Procedures   Ambulatory referral to Physical Therapy    Referral Priority:   Routine    Referral Type:   Physical Medicine    Referral Reason:   Specialty Services Required    Requested Specialty:   Physical Therapy    Number of Visits Requested:   1    Meds ordered this encounter  Medications   naproxen (NAPROSYN) 500 MG tablet  Sig: Take 1 tablet (500 mg total) by mouth 2 (two) times daily with a meal. For 1-2 weeks then as needed    Dispense:  60 tablet    Refill:  0    Follow up plan: Return if symptoms worsen or fail to improve.   Marsa Officer, DO [facility] 11/20/2023, 2:01 PM

## 2023-11-20 NOTE — Patient Instructions (Addendum)
 Thank you for coming to the office today.  Referral to Physical Therapy  [facility] Located in: [facility] Address: [address], [place], [place] [address] Phone: [phone]  Most likely you have bursitis of your shoulder. This is inflammation of the shoulder joint caused most often by arthritis or wear and tear. Often it can flare up to cause bursitis due to repetitive activities or other triggers. It may take time to heal, possibly 2 to 6 weeks, and it is important to avoid over use of shoulder especially above head motions that can re-aggravate the problem.  Recommend trial of Anti-inflammatory with Naproxen (Naprosyn) 500mg  tabs - take one with food and plenty of water TWICE daily every day (breakfast and dinner), for next 2 to 4 weeks, then you may take only as needed  - DO NOT TAKE any ibuprofen , aleve, motrin  while you are taking this medicine  Recommend to start taking Tylenol  Extra Strength 500mg  tabs - take 1 to 2 tabs per dose (max 1000mg ) every 6-8 hours for pain (take regularly, don't skip a dose for next 7 days), max 24 hour daily dose is 6 tablets or 3000mg . In the future you can repeat the same everyday Tylenol  course for 1-2 weeks at a time.    Consider muscle relaxant, X-ray other therapy.   Please schedule a Follow-up Appointment to: Return if symptoms worsen or fail to improve.  If you have any other questions or concerns, please feel free to call the office or send a message through MyChart. You may also schedule an earlier appointment if necessary.  Additionally, you may be receiving a survey about your experience at our office within a few days to 1 week by e-mail or mail. We value your feedback.  Marsa Officer, DO [facility], [facility]   Range of Motion Shoulder Exercises  Pendulum Circles - Lean with your good arm against a counter or table for support [facility] forward with a wide stance (make  sure your body is comfortable) - Your painful shoulder should hang down and feel heavy - Gently move your painful arm in small circles clockwise for several turns - Switch to counterclockwise for several turns - Early on keep circles narrow and move slowly - Later in rehab, move in larger circles and faster movement   Wall Crawl - Stand close (about 1-2 ft away) to a wall, facing it directly - Reach out with your arm of painful shoulder and place fingers (not palm) on wall - You should make contact with wall at your waist level - Slowly walk your fingers up the wall. Stay in contact with wall entire time, do not remove fingers - Keep walking fingers up wall until you reach shoulder level - You may feel tightening or mild discomfort, once you reach a height that causes pain or if you are already above your shoulder height then stop. Repeat from starting position. - Early on stand closer to wall, move fingers slowly, and stay at or below shoulder level - Later in rehab, stand farther away from wall (fingertips), move fingers quicker, go above shoulder level

## 2023-12-12 ENCOUNTER — Ambulatory Visit

## 2023-12-13 ENCOUNTER — Ambulatory Visit: Attending: Family Medicine

## 2023-12-13 DIAGNOSIS — M7542 Impingement syndrome of left shoulder: Secondary | ICD-10-CM | POA: Insufficient documentation

## 2023-12-13 DIAGNOSIS — M25512 Pain in left shoulder: Secondary | ICD-10-CM | POA: Diagnosis not present

## 2023-12-13 DIAGNOSIS — G8929 Other chronic pain: Secondary | ICD-10-CM | POA: Insufficient documentation

## 2023-12-13 NOTE — Therapy (Signed)
 OUTPATIENT PHYSICAL THERAPY SHOULDER/ELBOW EVALUATION  Patient Name: Reginald Yoder MRN: [id number] DOB:25-Aug-1980, 43 y.o., male Today's Date: 12/18/2023  END OF SESSION:  PT End of Session - 12/18/23 0805     Visit Number 1    Number of Visits 17    Date for Recertification  02/07/24    Authorization Type eval: 12/13/23    PT Start Time 1400    PT Stop Time 1445    PT Time Calculation (min) 45 min    Activity Tolerance Patient tolerated treatment well    Behavior During Therapy [facility] for tasks assessed/performed         Past Medical History:  Diagnosis Date   Allergy    GERD (gastroesophageal reflux disease)    Past Surgical History:  Procedure Laterality Date   TONSILLECTOMY Bilateral 1987   VASECTOMY  2017   WISDOM TOOTH EXTRACTION Bilateral 2002   Patient Active Problem List   Diagnosis Date Noted   Right inguinal hernia 12/15/2020   Partial retinal tear of right eye without detachment 07/14/2019   Gastroesophageal reflux disease 07/03/2019   Environmental and seasonal allergies 07/03/2019   PCP: Dr. Edman   REFERRING PROVIDER: Dr. Edman   REFERRING DIAG:  M75.42 (ICD-10-CM) - Rotator cuff impingement syndrome of left shoulder  M25.512,G89.29 (ICD-10-CM) - Chronic left shoulder pain   RATIONALE FOR EVALUATION AND TREATMENT: Rehabilitation  THERAPY DIAG: Chronic left shoulder pain  ONSET DATE: Approximately 2 months ago  FOLLOW-UP APPT SCHEDULED WITH REFERRING PROVIDER: No    SUBJECTIVE:                                                                                                                                                                                         SUBJECTIVE STATEMENT:  L shoulder pain  PERTINENT HISTORY:  Pt referred for L shoulder pain which started approximately 2 months ago. At that time he was weight lifting approximately 3 days per week. He would exercise on alternating days doing a general full body workout and  would typically perform 3-5 sets of 5-6 reps for each exercises. He was having pain with all pressing-type movements and stopped lifting but the pain never resolved. Initially he was experiencing tingling in the L hand (digits 4 and 5). Pt also complains of L sided neck pain which he notices pain with cross body reaching especially wit his arm at shoulder height. No pain when reaching up his back. He has been working on ROM and resting since seeing Dr. Edman. Symptoms progressively improved initially but he has had two re-aggravating episodes.  He gets occasional L shoulder popping which offers occasional relief. He has  a history of right shoulder AC joint injury sustained during a football game as a teenager. No prior injuries to the left shoulder. He also reports Intermittent neck stiffness with an episode of a neck strain 5-6 months ago.    PAIN:  Pain Intensity: Present: /10, Best: /10, Worst: /10 Pain location: Lateral L shoulder Pain Quality: achy with intermittent sharp pain Radiating: Yes, possible radiation down posterior arm through tricep Numbness/Tingling: Yes, initially numbness in digits 4 and 5 of L hand but not currently Focal Weakness: No Aggravating factors: pressing motions (esp. overhead/incline), horizontal abduction, cross body horizontal adduction at shoulder height Relieving factors: Rest, heat, Naproxen  (he took it for 2 weeks with notable improvement) 24-hour pain behavior: activity dependent History of prior shoulder or neck/shoulder injury, pain, surgery, or therapy: No Dominant hand: right (also plays golf right handed); Imaging: No  Red flags: Negative for personal history of cancer, chills/fever, night sweats, nausea, vomiting, unrelenting pain, unexplained weight gain/loss, chest pain/palpitations, SOB  PRECAUTIONS: None  WEIGHT BEARING RESTRICTIONS: No  Living Environment Lives with: lives with their family, wife and three kids  Prior level of function:  Independent  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  Recent memory is intact.  Remote memory is intact.  Attention span and concentration are intact.  Expressive speech is intact.  Patient's fund of knowledge is within normal limits for educational level.    Gross Musculoskeletal Assessment Tremor: None Bulk: Normal Tone: Normal  Gait Deferred  Posture No gross deficits noted  Cervical Screen AROM: WFL and painless with overpressure in all planes Spurlings A (ipsilateral lateral flexion/axial compression): R: Negative L: Negative Spurlings B (ipsilateral lateral flexion/contralateral rotation/axial compression): R: Negative L: Negative Repeated movement: No centralization or peripheralization with protraction or retraction Hoffman Sign (cervical cord compression): R: Not examined L: Not examined ULTT Median: R: Not examined L: Not examined ULTT Ulnar: R: Not examined L: Not examined ULTT Radial: R: Not examined L: Not examined  AROM AROM (Normal range in degrees) AROM  Cervical  Flexion (50) WNL  Extension (80) WNL  Right lateral flexion (45) WNL  Left lateral flexion (45) WNL  Right rotation (85) WNL  Left rotation (85) WNL   Right Left  Shoulder    Flexion 171 155*  Extension    Abduction 184 98*  External Rotation 92 70*  Internal Rotation 55 50*  Hands Behind Head    Hands Behind Back        Elbow    Flexion WNL WNL  Extension WNL WNL  Pronation    Supination    (* = pain; Blank rows = not tested)  Painful arc in flexion starts at 80 degrees; Painful arc in abduction starts at 77 degrees;  UE MMT: MMT (out of 5) Right  Left   Cervical (isometric)  Flexion WNL  Extension WNL  Lateral Flexion WNL WNL  Rotation WNL WNL      Shoulder   Flexion 5 5*  Extension    Abduction 5 4*  External rotation 5 5  Internal rotation 5 5  Horizontal abduction    Horizontal adduction    Lower Trapezius  Rhomboids        Elbow  Flexion 5 5  Extension 5 5  Pronation 5 5  Supination 5 5*      Wrist  Flexion 5 5  Extension 5 5      MCP  Flexion 5 5  Extension 5 5  Abduction 5 5  Adduction 5 5  (* = pain; Blank rows = not tested)  Sensation Grossly intact to light touch bilateral UE as determined by testing dermatomes C2-T2. Proprioception and hot/cold testing deferred on this date.  Reflexes Deferred  Palpation Location LEFT  RIGHT           Subocciptials    Cervical paraspinals    Upper Trapezius    Levator Scapulae    Rhomboid Major/Minor    Sternoclavicular joint    Acromioclavicular joint 0   Coracoid process    Long head of biceps 1   Supraspinatus 0   Infraspinatus 0   Subscapularis 0   Teres Minor 0   Teres Major    Pectoralis Major    Pectoralis Minor    Anterior Deltoid 1   Lateral Deltoid 1   Posterior Deltoid    Latissimus Dorsi    Sternocleidomastoid    (Blank rows = not tested) Graded on 0-4 scale (0 = no pain, 1 = pain, 2 = pain with wincing/grimacing/flinching, 3 = pain with withdrawal, 4 = unwilling to allow palpation), (Blank rows = not tested)  Accessory Motions/Glides Pt denies reproduction of shoulder pain with CPA C2-T7 and UPA bilaterally C2-T7. Intermittent hypomobility throughout thoracic spine but not reproduction of symptoms  SPECIAL TESTS Rotator Cuff  Drop Arm Test: Negative Painful Arc (Pain from 60 to 120 degrees scaption): Positive Infraspinatus Muscle Test: Negative If all 3 tests positive, the probability of a full-thickness rotator cuff tear is 91%  Subacromial Impingement Hawkins-Kennedy: Positive Neer (Block scapula, PROM flexion):  Positive Painful Arc (Pain from 60 to 120 degrees scaption): Positive Empty Can: Negative External Rotation Resistance: Negative Horizontal Adduction: Not examined Scapular Assist: Positive  Labral Tear Biceps Load II (120 elevation, full ER, 90 elbow flexion, full supination, resisted elbow flexion): Not examined Crank (160 scaption, axial load with IR/ER): Not examined O'Briens/Active Compression Test (90 shoulder flexion, 10 adduction, full IR): Not examined  Bicep Tendon Pathology Speed (shoulder flexion to 90, external rotation, full elbow extension, and forearm supination with resistance: Negative Yergason's (resisted shoulder ER and supination/biceps tendon pathology): Not examined  Shoulder Instability Sulcus Sign: Negative Anterior Apprehension: Negative  Beighton scale Deferred   TODAY'S TREATMENT   Ther-ex  Interpreted and educated pt regarding examination findings and implications for HEP; HEP issued and demonstrated for patient with extensive education;   PATIENT EDUCATION:  Education details: Examination findings, plan of care, and HEP Person educated: Patient Education method: Explanation Education comprehension: verbalized understanding   HOME EXERCISE PROGRAM:  Access Code: [email or web address] URL: [email or web address] Date: 12/18/2023 Prepared by: Selinda Eck  Exercises - Seated Shoulder External Rotation PROM on Table  - 2 x daily - 7 x weekly - 3-5 reps - 10-20s hold - Seated Shoulder Abduction Towel Slide at Table Top with Forearm in Neutral  - 2 x daily - 7 x weekly - 3-5 reps - 10-20s hold - Seated Shoulder Flexion Slide at Table Top with Forearm in Neutral  - 2 x daily - 7 x weekly - 3-5 reps - 10-20s hold   ASSESSMENT:  CLINICAL IMPRESSION: Patient is a 43 y.o. male who was seen  today for physical therapy evaluation and treatment for L shoulder pain. Most significant findings are limited ROM, painful MMT, and positive impingement  testing.   OBJECTIVE IMPAIRMENTS: decreased ROM, decreased strength, and pain.   ACTIVITY LIMITATIONS: carrying, lifting, reach over head, and caring for others  PARTICIPATION LIMITATIONS: community activity and exercise and golf  PERSONAL FACTORS: Time since onset of injury/illness/exacerbation are also affecting patient's functional outcome.   REHAB POTENTIAL: Excellent  CLINICAL DECISION MAKING: Stable/uncomplicated  EVALUATION COMPLEXITY: Low   GOALS: Goals reviewed with patient? Yes  SHORT TERM GOALS: Target date: 01/10/2024  Pt will be independent with HEP to improve strength and decrease shoulder pain to improve pain-free function at home and when exercising/playing golf Baseline:  Goal status: INITIAL   LONG TERM GOALS: Target date: 02/07/2024  Pt will decrease quick DASH score by at least 8% in order to demonstrate clinically significant reduction in disability related to shoulder pain  Baseline: 34.1% Goal status: INITIAL  2.  Pt will decrease worst shoulder pain by at least 3 points on the NPRS in order to demonstrate clinically significant reduction in shoulder pain. Baseline: To be assessed Goal status: INITIAL  3.  Pt will increase L shoulder flexion, abduction, and ER to within 10 degrees of R side in order to improve functional overhead motion so he can complete all household responsibilities and exercise/golf without pain;        Baseline: see note for measurements Goal status: INITIAL  4. Pt will increase L shoulder pain-free abduction strength to 5/5 MMT grade in order lift weights and golf without pain.         Baseline: 4/5 with pain; Goal status: INITIAL   PLAN: PT FREQUENCY: 1-2x/week  PT DURATION: 8 weeks  PLANNED INTERVENTIONS: Therapeutic exercises, Therapeutic activity, Neuromuscular re-education, Balance training, Gait training, Patient/Family education, Self Care, Joint mobilization, Joint manipulation, Vestibular training, Canalith  repositioning, Orthotic/Fit training, DME instructions, Dry Needling, Electrical stimulation, Spinal manipulation, Spinal mobilization, Cryotherapy, Moist heat, Taping, Traction, Ultrasound, Ionotophoresis 4mg /ml Dexamethasone , Manual therapy, and Re-evaluation.  PLAN FOR NEXT SESSION: ask worst/best pain, ULTT, HBB/HBH measurements, periscapular strength testing, shoulder PAM, additional special testing   Selinda BIRCH Zenobia Kuennen PT, DPT, GCS  Nihar Klus, PT 12/18/2023, 8:35 AM

## 2023-12-18 NOTE — Therapy (Signed)
 OUTPATIENT PHYSICAL THERAPY SHOULDER/ELBOW TREATMENT  Patient Name: Reginald Yoder MRN: [id number] DOB:Dec 28, 1980, 43 y.o., male Today's Date: 12/19/2023  END OF SESSION:  PT End of Session - 12/19/23 0800     Visit Number 2    Number of Visits 17    Date for Recertification  02/07/24    Authorization Type eval: 12/13/23    PT Start Time 0800    PT Stop Time 0845    PT Time Calculation (min) 45 min    Activity Tolerance Patient tolerated treatment well    Behavior During Therapy [facility] for tasks assessed/performed         Past Medical History:  Diagnosis Date   Allergy    GERD (gastroesophageal reflux disease)    Past Surgical History:  Procedure Laterality Date   TONSILLECTOMY Bilateral 1987   VASECTOMY  2017   WISDOM TOOTH EXTRACTION Bilateral 2002   Patient Active Problem List   Diagnosis Date Noted   Right inguinal hernia 12/15/2020   Partial retinal tear of right eye without detachment 07/14/2019   Gastroesophageal reflux disease 07/03/2019   Environmental and seasonal allergies 07/03/2019   PCP: Dr. Edman   REFERRING PROVIDER: Dr. Edman   REFERRING DIAG:  M75.42 (ICD-10-CM) - Rotator cuff impingement syndrome of left shoulder  M25.512,G89.29 (ICD-10-CM) - Chronic left shoulder pain   RATIONALE FOR EVALUATION AND TREATMENT: Rehabilitation  THERAPY DIAG: Chronic left shoulder pain  ONSET DATE: Approximately 2 months ago  FOLLOW-UP APPT SCHEDULED WITH REFERRING PROVIDER: No   FROM INITIAL EVALUATION SUBJECTIVE:                                                                                                                                                                                         SUBJECTIVE STATEMENT:  L shoulder pain  PERTINENT HISTORY:  Pt referred for L shoulder pain which started approximately 2 months ago. At that time he was weight lifting approximately 3 days per week. He would exercise on alternating days doing a general  full body workout and would typically perform 3-5 sets of 5-6 reps for each exercises. He was having pain with all pressing-type movements and stopped lifting but the pain never resolved. Initially he was experiencing tingling in the L hand (digits 4 and 5). Pt also complains of L sided neck pain which he notices pain with cross body reaching especially wit his arm at shoulder height. No pain when reaching up his back. He has been working on ROM and resting since seeing Dr. Edman. Symptoms progressively improved initially but he has had two re-aggravating episodes.  He gets occasional L shoulder popping which offers occasional relief.  He has a history of right shoulder AC joint injury sustained during a football game as a teenager. No prior injuries to the left shoulder. He also reports Intermittent neck stiffness with an episode of a neck strain 5-6 months ago.    PAIN:  Pain Intensity: Present: /10, Best: /10, Worst: /10 Pain location: Lateral L shoulder Pain Quality: achy with intermittent sharp pain Radiating: Yes, possible radiation down posterior arm through tricep Numbness/Tingling: Yes, initially numbness in digits 4 and 5 of L hand but not currently Focal Weakness: No Aggravating factors: pressing motions (esp. overhead/incline), horizontal abduction, cross body horizontal adduction at shoulder height Relieving factors: Rest, heat, Naproxen  (he took it for 2 weeks with notable improvement) 24-hour pain behavior: activity dependent History of prior shoulder or neck/shoulder injury, pain, surgery, or therapy: No Dominant hand: right (also plays golf right handed); Imaging: No  Red flags: Negative for personal history of cancer, chills/fever, night sweats, nausea, vomiting, unrelenting pain, unexplained weight gain/loss, chest pain/palpitations, SOB  PRECAUTIONS: None  WEIGHT BEARING RESTRICTIONS: No  Living Environment Lives with: lives with their family, wife and three  kids  Prior level of function: Independent  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  Recent memory is intact.  Remote memory is intact.  Attention span and concentration are intact.  Expressive speech is intact.  Patient's fund of knowledge is within normal limits for educational level.    Gross Musculoskeletal Assessment Tremor: None Bulk: Normal Tone: Normal  Gait Deferred  Posture No gross deficits noted  Cervical Screen AROM: WFL and painless with overpressure in all planes Spurlings A (ipsilateral lateral flexion/axial compression): R: Negative L: Negative Spurlings B (ipsilateral lateral flexion/contralateral rotation/axial compression): R: Negative L: Negative Repeated movement: No centralization or peripheralization with protraction or retraction Hoffman Sign (cervical cord compression): R: Not examined L: Not examined ULTT Median: R: Not examined L: Not examined ULTT Ulnar: R: Not examined L: Not examined ULTT Radial: R: Not examined L: Not examined  AROM AROM (Normal range in degrees) AROM  Cervical  Flexion (50) WNL  Extension (80) WNL  Right lateral flexion (45) WNL  Left lateral flexion (45) WNL  Right rotation (85) WNL  Left rotation (85) WNL   Right Left  Shoulder    Flexion 171 155*  Extension    Abduction 184 98*  External Rotation 92 70*  Internal Rotation 55 50*  Hands Behind Head    Hands Behind Back        Elbow    Flexion WNL WNL  Extension WNL WNL  Pronation    Supination    (* = pain; Blank rows = not tested)  Painful arc in flexion starts at 80 degrees; Painful arc in abduction starts at 77  degrees;  UE MMT: MMT (out of 5) Right Left   Cervical (isometric)  Flexion WNL  Extension WNL  Lateral Flexion WNL WNL  Rotation WNL WNL      Shoulder   Flexion 5 5*  Extension    Abduction 5 4*  External rotation 5 5  Internal rotation 5 5  Horizontal abduction    Horizontal adduction    Lower Trapezius  Rhomboids        Elbow  Flexion 5 5  Extension 5 5  Pronation 5 5  Supination 5 5*      Wrist  Flexion 5 5  Extension 5 5      MCP  Flexion 5 5  Extension 5 5  Abduction 5 5  Adduction 5 5  (* = pain; Blank rows = not tested)  Sensation Grossly intact to light touch bilateral UE as determined by testing dermatomes C2-T2. Proprioception and hot/cold testing deferred on this date.  Reflexes Deferred  Palpation Location LEFT  RIGHT           Subocciptials    Cervical paraspinals    Upper Trapezius    Levator Scapulae    Rhomboid Major/Minor    Sternoclavicular joint    Acromioclavicular joint 0   Coracoid process    Long head of biceps 1   Supraspinatus 0   Infraspinatus 0   Subscapularis 0   Teres Minor 0   Teres Major    Pectoralis Major    Pectoralis Minor    Anterior Deltoid 1   Lateral Deltoid 1   Posterior Deltoid    Latissimus Dorsi    Sternocleidomastoid    (Blank rows = not tested) Graded on 0-4 scale (0 = no pain, 1 = pain, 2 = pain with wincing/grimacing/flinching, 3 = pain with withdrawal, 4 = unwilling to allow palpation), (Blank rows = not tested)  Accessory Motions/Glides Pt denies reproduction of shoulder pain with CPA C2-T7 and UPA bilaterally C2-T7. Intermittent hypomobility throughout thoracic spine but not reproduction of symptoms  SPECIAL TESTS Rotator Cuff  Drop Arm Test: Negative Painful Arc (Pain from 60 to 120 degrees scaption): Positive Infraspinatus Muscle Test: Negative If all 3 tests positive, the probability of a full-thickness rotator cuff tear is 91%  Subacromial Impingement Hawkins-Kennedy:  Positive Neer (Block scapula, PROM flexion): Positive Painful Arc (Pain from 60 to 120 degrees scaption): Positive Empty Can: Negative External Rotation Resistance: Negative Horizontal Adduction: Not examined Scapular Assist: Positive  Labral Tear Biceps Load II (120 elevation, full ER, 90 elbow flexion, full supination, resisted elbow flexion): Not examined Crank (160 scaption, axial load with IR/ER): Not examined O'Briens/Active Compression Test (90 shoulder flexion, 10 adduction, full IR): Not examined  Bicep Tendon Pathology Speed (shoulder flexion to 90, external rotation, full elbow extension, and forearm supination with resistance: Negative Yergason's (resisted shoulder ER and supination/biceps tendon pathology): Not examined  Shoulder Instability Sulcus Sign: Negative Anterior Apprehension: Negative  Beighton scale Deferred   TODAY'S TREATMENT    SUBJECTIVE: Pt reports that he is doing well today. No changes since the initial evaluation. He reports some increasing intermittent numbness during HEP L shoulder abduction stretches. Denies resting pain upon arrival today. No specific questions or concerns.    PAIN: Denies   Ther-ex   Pain Intensity: Present: 0/10, Best: 0/10, Worst: 4/10  Cervical Screen Hoffman Sign (cervical cord compression): R: Positive L: Positive ULTT: Attempted but unable to perform due to pain initially with shoulder depression;  Hand behind back: R: T6  L: T1 Hand behind head: R: T7 L: T12   Accessory Motions/Glides Glenohumeral: Posterior: R: normal L: Hypomobile Inferior: R: normal L: Hypomobile Anterior: R: not examined L: not examined  Acromioclavicular:  Posterior: R: not examined L: normal Anterior: R: not examined L: normal  Sternoclavicular: Posterior: R: not examined L: normal Anterior: R: not examined L: normal Superior: R: not examined L: normal Inferior: R: not  examined L: normal  Scapulothoracic: Distraction: R:  not examined L: not examined Medial: R: not examined L: normal Lateral: R: not examined L: normal Inferior: R: not examined L: normal Superior: R: not examined L: normal Upward Rotation: R: not examined L: normal Downward Rotation: R: not examined L: normal  UE MMT: MMT (out of 5) Right Left   Shoulder   Extension 4 4  Horizontal abduction 4 4  Horizontal adduction    Lower Trapezius 4 Unable to test due to pain  Rhomboids 5 5  (* = pain; Blank rows = not tested)  HEP issued with handout and extensive education;   Manual Therapy   Supine T5 thrust manipulation with cavitation; Prone T3-T6 CPA, grade III, 30s/bout x 1 bouts at each level;  Supine L GH AP mobilizations at neutral, grade III, 30s/bout x 2 bouts; Supine L GH AP mobilizations at 90 abduction and available end range ER, grade III, 30s/bout x 2 bouts; Supine L GH inferior mobilizations at 90 abduction, grade III, 30s/bout x 2 bouts; Extensive STM to L pec major and pec minor;   PATIENT EDUCATION:  Education details: Findings, plan of care, and updated HEP; Person educated: Patient Education method: Explanation, Demonstration, and Handouts Education comprehension: verbalized understanding   HOME EXERCISE PROGRAM:  Access Code: [email or web address] URL: [email or web address] Date: 12/19/2023 Prepared by: Selinda Eck  Exercises - Seated Shoulder Abduction Towel Slide at Table Top with Forearm in Neutral  - 2 x daily - 7 x weekly - 3-5 reps - 30-45s hold - Seated Shoulder Flexion Slide at Table Top with Forearm in Neutral  - 2 x daily - 7 x weekly - 3-5 reps - 30-45s hold - Supine Static Chest Stretch on Foam Roll  - 2 x daily - 7 x weekly - 3-5 reps - 30-45s hold - Supine Chest Stretch with Elbows Bent  - 2 x daily - 7 x weekly - 3-5 reps - 30-45s hold - Thoracic Extension Mobilization on Foam Roll  - 2 x daily - 7 x weekly - 2 sets - 10 reps - 3-5s hold - Standing Shoulder Row with Anchored Resistance  -  1 x daily - 7 x weekly - 2 sets - 10 reps - 3-5s hold   ASSESSMENT:  CLINICAL IMPRESSION: Performed additional examination items during session today. Also initiated manual techniques to improve L shoulder ROM. Updated HEP and reviewed with patient. Pt encouraged to follow-up as scheduled. He will benefit from PT services to address deficits in ROM, strength, and pain in order to improve pain-free function at home and during exercise.  OBJECTIVE IMPAIRMENTS: decreased ROM, decreased strength, and pain.   ACTIVITY LIMITATIONS: carrying, lifting, reach over head, and caring for others  PARTICIPATION LIMITATIONS: community activity and exercise and golf  PERSONAL FACTORS: Time since onset of injury/illness/exacerbation are also affecting patient's functional outcome.   REHAB POTENTIAL: Excellent  CLINICAL DECISION MAKING: Stable/uncomplicated  EVALUATION COMPLEXITY: Low   GOALS: Goals reviewed with patient? Yes  SHORT TERM GOALS: Target date: 01/10/2024  Pt will be independent with HEP to improve strength and decrease shoulder pain to improve pain-free function at home and when exercising/playing golf Baseline:  Goal status: INITIAL   LONG TERM GOALS: Target date: 02/07/2024  Pt will decrease quick DASH score by at least 8% in order to demonstrate clinically significant reduction in disability related to shoulder pain  Baseline: 34.1% Goal status: INITIAL  2.  Pt will decrease worst shoulder pain by at least 3 points  on the NPRS in order to demonstrate clinically significant reduction in shoulder pain. Baseline: 12/19/23: 4/10; Goal status: INITIAL  3.  Pt will increase L shoulder flexion, abduction, and ER to within 10 degrees of R side in order to improve functional overhead motion so he can complete all household responsibilities and exercise/golf without pain;     Baseline: see note for measurements Goal status: INITIAL  4. Pt will increase L shoulder pain-free  abduction strength to 5/5 MMT grade in order lift weights and golf without pain.         Baseline: 4/5 with pain; Goal status: INITIAL   PLAN: PT FREQUENCY: 1-2x/week  PT DURATION: 8 weeks  PLANNED INTERVENTIONS: Therapeutic exercises, Therapeutic activity, Neuromuscular re-education, Balance training, Gait training, Patient/Family education, Self Care, Joint mobilization, Joint manipulation, Vestibular training, Canalith repositioning, Orthotic/Fit training, DME instructions, Dry Needling, Electrical stimulation, Spinal manipulation, Spinal mobilization, Cryotherapy, Moist heat, Taping, Traction, Ultrasound, Ionotophoresis 4mg /ml Dexamethasone , Manual therapy, and Re-evaluation.  PLAN FOR NEXT SESSION: progress ROM and strength, manual techniques as needed. Progress/revise HEP as necessary;   Lion Fernandez D Dominico Rod PT, DPT, GCS  Ereka Brau, PT 12/19/2023, 2:26 PM

## 2023-12-19 ENCOUNTER — Ambulatory Visit

## 2023-12-19 DIAGNOSIS — M7542 Impingement syndrome of left shoulder: Secondary | ICD-10-CM | POA: Diagnosis not present

## 2023-12-19 DIAGNOSIS — M25512 Pain in left shoulder: Secondary | ICD-10-CM | POA: Diagnosis not present

## 2023-12-19 DIAGNOSIS — G8929 Other chronic pain: Secondary | ICD-10-CM

## 2023-12-20 NOTE — Therapy (Signed)
 OUTPATIENT PHYSICAL THERAPY SHOULDER/ELBOW TREATMENT  Patient Name: Reginald Yoder MRN: [id number] DOB:[birth year], 43 y.o., male Today's Date: 12/26/2023  END OF SESSION:  PT End of Session - 12/26/23 0846     Visit Number 3    Number of Visits 17    Date for Recertification  02/07/24    Authorization Type eval: 12/13/23    PT Start Time 0847    PT Stop Time 0930    PT Time Calculation (min) 43 min    Activity Tolerance Patient tolerated treatment well    Behavior During Therapy [facility] for tasks assessed/performed         Past Medical History:  Diagnosis Date   Allergy    GERD (gastroesophageal reflux disease)    Past Surgical History:  Procedure Laterality Date   TONSILLECTOMY Bilateral 1987   VASECTOMY  2017   WISDOM TOOTH EXTRACTION Bilateral 2002   Patient Active Problem List   Diagnosis Date Noted   Right inguinal hernia 12/15/2020   Partial retinal tear of right eye without detachment 07/14/2019   Gastroesophageal reflux disease 07/03/2019   Environmental and seasonal allergies 07/03/2019   PCP: Dr. Edman   REFERRING PROVIDER: Dr. Edman   REFERRING DIAG:  M75.42 (ICD-10-CM) - Rotator cuff impingement syndrome of left shoulder  M25.512,G89.29 (ICD-10-CM) - Chronic left shoulder pain   RATIONALE FOR EVALUATION AND TREATMENT: Rehabilitation  THERAPY DIAG: Chronic left shoulder pain  ONSET DATE: Approximately 2 months ago  FOLLOW-UP APPT SCHEDULED WITH REFERRING PROVIDER: No   FROM INITIAL EVALUATION SUBJECTIVE:                                                                                                                                                                                         SUBJECTIVE STATEMENT:  L shoulder pain  PERTINENT HISTORY:  Pt referred for L shoulder pain which started approximately 2 months ago. At that time he was weight lifting approximately 3 days per week. He would exercise on alternating days doing a general  full body workout and would typically perform 3-5 sets of 5-6 reps for each exercises. He was having pain with all pressing-type movements and stopped lifting but the pain never resolved. Initially he was experiencing tingling in the L hand (digits 4 and 5). Pt also complains of L sided neck pain which he notices pain with cross body reaching especially wit his arm at shoulder height. No pain when reaching up his back. He has been working on ROM and resting since seeing Dr. Edman. Symptoms progressively improved initially but he has had two re-aggravating episodes.  He gets occasional L shoulder popping which offers occasional relief.  He has a history of right shoulder AC joint injury sustained during a football game as a teenager. No prior injuries to the left shoulder. He also reports Intermittent neck stiffness with an episode of a neck strain 5-6 months ago.    PAIN:  Pain Intensity: Present: 0/10, Best: 0/10, Worst: 4/10 Pain location: Lateral L shoulder Pain Quality: achy with intermittent sharp pain Radiating: Yes, possible radiation down posterior arm through tricep Numbness/Tingling: Yes, initially numbness in digits 4 and 5 of L hand but not currently Focal Weakness: No Aggravating factors: pressing motions (esp. overhead/incline), horizontal abduction, cross body horizontal adduction at shoulder height Relieving factors: Rest, heat, Naproxen  (he took it for 2 weeks with notable improvement) 24-hour pain behavior: activity dependent History of prior shoulder or neck/shoulder injury, pain, surgery, or therapy: No Dominant hand: right (also plays golf right handed); Imaging: No  Red flags: Negative for personal history of cancer, chills/fever, night sweats, nausea, vomiting, unrelenting pain, unexplained weight gain/loss, chest pain/palpitations, SOB  PRECAUTIONS: None  WEIGHT BEARING RESTRICTIONS: No  Living Environment Lives with: lives with their family, wife and three  kids  Prior level of function: Independent  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  Recent memory is intact.  Remote memory is intact.  Attention span and concentration are intact.  Expressive speech is intact.  Patient's fund of knowledge is within normal limits for educational level.    Gross Musculoskeletal Assessment Tremor: None Bulk: Normal Tone: Normal  Gait Deferred  Posture No gross deficits noted  Cervical Screen AROM: WFL and painless with overpressure in all planes Spurlings A (ipsilateral lateral flexion/axial compression): R: Negative L: Negative Spurlings B (ipsilateral lateral flexion/contralateral rotation/axial compression): R: Negative L: Negative Hoffman Sign (cervical cord compression): R: Positive L: Positive ULTT: Attempted but unable to perform due to pain initially with shoulder depression;  Accessory Motions/Glides Glenohumeral: Posterior: R: normal L: Hypomobile Inferior: R: normal L: Hypomobile Anterior: R: not examined L: not examined  Acromioclavicular:  Posterior: R: not examined L: normal Anterior: R: not examined L: normal  Sternoclavicular: Posterior: R: not examined L: normal Anterior: R: not examined L: normal Superior: R: not examined L: normal Inferior: R: not examined L: normal  Scapulothoracic: Distraction: R: not examined L: not examined Medial: R: not examined L: normal Lateral: R: not examined L: normal Inferior: R: not examined L: normal Superior: R: not examined L: normal Upward Rotation: R: not examined L: normal Downward Rotation: R: not examined L: normal  AROM AROM (Normal range in  degrees) AROM  Cervical  Flexion (50) WNL  Extension (80) WNL  Right lateral flexion (45) WNL  Left lateral flexion (45) WNL  Right rotation (85) WNL  Left rotation (85) WNL   Right Left  Shoulder    Flexion 171 155*  Extension    Abduction 184 98*  External Rotation 92 70*  Internal Rotation 55 50*  Hands Behind Head    Hands Behind Back        Elbow    Flexion WNL WNL  Extension WNL WNL  Pronation    Supination    (* = pain; Blank rows = not tested)  Painful  arc in flexion starts at 80 degrees; Painful arc in abduction starts at 77 degrees;  UE MMT: MMT (out of 5) Right Left   Cervical (isometric)  Flexion WNL  Extension WNL  Lateral Flexion WNL WNL  Rotation WNL WNL      Shoulder   Flexion 5 5*  Extension    Abduction 5 4*  External rotation 5 5  Internal rotation 5 5  Horizontal abduction    Horizontal adduction    Lower Trapezius    Rhomboids        Elbow  Flexion 5 5  Extension 5 5  Pronation 5 5  Supination 5 5*      Wrist  Flexion 5 5  Extension 5 5      MCP  Flexion 5 5  Extension 5 5  Abduction 5 5  Adduction 5 5  (* = pain; Blank rows = not tested)  Hand behind back: R: T6  L: T1 Hand behind head: R: T7 L: T12   UE MMT: MMT (out of 5) Right Left   Shoulder   Extension 4 4  Horizontal abduction 4 4  Horizontal adduction    Lower Trapezius 4 Unable to test due to pain  Rhomboids 5 5  (* = pain; Blank rows = not tested)  Sensation Grossly intact to light touch bilateral UE as determined by testing dermatomes C2-T2. Proprioception and hot/cold testing deferred on this date.  Reflexes Deferred  Palpation Location LEFT  RIGHT           Subocciptials    Cervical paraspinals    Upper Trapezius    Levator Scapulae    Rhomboid Major/Minor    Sternoclavicular joint    Acromioclavicular joint 0   Coracoid process    Long head of biceps 1   Supraspinatus 0   Infraspinatus 0   Subscapularis 0   Teres Minor 0   Teres  Major    Pectoralis Major    Pectoralis Minor    Anterior Deltoid 1   Lateral Deltoid 1   Posterior Deltoid    Latissimus Dorsi    Sternocleidomastoid    (Blank rows = not tested) Graded on 0-4 scale (0 = no pain, 1 = pain, 2 = pain with wincing/grimacing/flinching, 3 = pain with withdrawal, 4 = unwilling to allow palpation), (Blank rows = not tested)  Accessory Motions/Glides Pt denies reproduction of shoulder pain with CPA C2-T7 and UPA bilaterally C2-T7. Intermittent hypomobility throughout thoracic spine but not reproduction of symptoms  SPECIAL TESTS Rotator Cuff  Drop Arm Test: Negative Painful Arc (Pain from 60 to 120 degrees scaption): Positive Infraspinatus Muscle Test: Negative If all 3 tests positive, the probability of a full-thickness rotator cuff tear is 91%  Subacromial Impingement Hawkins-Kennedy: Positive Neer (Block scapula, PROM flexion): Positive Painful Arc (Pain from 60 to 120 degrees scaption): Positive Empty Can: Negative External Rotation Resistance: Negative Horizontal Adduction: Not examined Scapular Assist: Positive  Labral Tear Biceps Load II (120 elevation, full ER, 90 elbow flexion, full supination, resisted elbow flexion): Not examined Crank (160 scaption, axial load with IR/ER): Not examined O'Briens/Active Compression Test (90 shoulder flexion, 10 adduction, full IR): Not examined  Bicep Tendon Pathology Speed (shoulder flexion to 90, external rotation, full elbow extension, and forearm supination with resistance: Negative Yergason's (resisted shoulder ER and supination/biceps tendon pathology): Not examined  Shoulder Instability Sulcus Sign: Negative Anterior Apprehension: Negative  Beighton scale Deferred   TODAY'S TREATMENT    SUBJECTIVE: Pt  reports that he is doing well today. HEP is going well and he is noticing gradually improvement in ROM. Denies resting pain upon arrival today. No specific questions or concerns.    PAIN:  Denies resting pain   Ther-ex  UBE x 5 minutes (2.5 forward/2.5 backward) for UE ROM and strengthening; Prone shoulder extension 3# DB x 15, 4# DB x 15; Prone mid trap high row 4# DB 2 x 15; Prone Y, unweighted x 15, 2# DB x 15; Supine serratus punch with 4# DB 2 x 15; Wall push-up plus x 10; Wall slide with low trap lift off, green tband x 10; Standing W's with green tband x 10; HEP updated and reviewed with patient;    Manual Therapy   Supine T5 thrust manipulation with cavitation; Prone T3-T7 CPA, grade III, 30s/bout x 1 bouts at each level;  Supine L GH AP mobilizations at neutral, grade III, 30s/bout x 2 bouts; Supine L GH AP mobilizations at 90 abduction and available end range ER, grade III, 30s/bout x 2 bouts; Supine L GH inferior mobilizations at 90 abduction, grade III, 30s/bout x 2 bouts; Extensive STM to L pec major and pec minor;   PATIENT EDUCATION:  Education details: Findings, plan of care, and updated HEP; Person educated: Patient Education method: Explanation, Demonstration, and Handouts Education comprehension: verbalized understanding   HOME EXERCISE PROGRAM:  Access Code: [email or web address] URL: [email or web address] Date: 12/26/2023 Prepared by: Selinda Eck  Exercises - Seated Shoulder Abduction Towel Slide at Table Top with Forearm in Neutral  - 2 x daily - 7 x weekly - 3-5 reps - 30-45s hold - Seated Shoulder Flexion Slide at Table Top with Forearm in Neutral  - 2 x daily - 7 x weekly - 3-5 reps - 30-45s hold - Supine Static Chest Stretch on Foam Roll  - 2 x daily - 7 x weekly - 3-5 reps - 30-45s hold - Supine Chest Stretch with Elbows Bent  - 2 x daily - 7 x weekly - 3-5 reps - 30-45s hold - Thoracic Extension Mobilization on Foam Roll  - 2 x daily - 7 x weekly - 2 sets - 10 reps - 3-5s hold - Standing Shoulder Row with Anchored Resistance  - 1 x daily - 7 x weekly - 2 sets - 10 reps - 3-5s hold - Shoulder W - External Rotation with  Resistance  - 1 x daily - 7 x weekly - 2 sets - 10 reps - Standing Low Trap Setting with Resistance at Wall  - 1 x daily - 7 x weekly - 2 sets - 10 reps   ASSESSMENT:  CLINICAL IMPRESSION: Progressed manual techniques to improve L shoulder ROM as well as strengthening. Improving L shoulder flexion, abduction, and ER measured today compared to initial evaluation. Updated HEP and reviewed with patient. Pt encouraged to follow-up as scheduled. He will benefit from PT services to address deficits in ROM, strength, and pain in order to improve pain-free function at home and during exercise.  OBJECTIVE IMPAIRMENTS: decreased ROM, decreased strength, and pain.   ACTIVITY LIMITATIONS: carrying, lifting, reach over head, and caring for others  PARTICIPATION LIMITATIONS: community activity and exercise and golf  PERSONAL FACTORS: Time since onset of injury/illness/exacerbation are also affecting patient's functional outcome.   REHAB POTENTIAL: Excellent  CLINICAL DECISION MAKING: Stable/uncomplicated  EVALUATION COMPLEXITY: Low   GOALS: Goals reviewed with patient? Yes  SHORT TERM GOALS: Target date: 01/10/2024  Pt will be independent with HEP to improve strength and  decrease shoulder pain to improve pain-free function at home and when exercising/playing golf Baseline:  Goal status: INITIAL   LONG TERM GOALS: Target date: 02/07/2024  Pt will decrease quick DASH score by at least 8% in order to demonstrate clinically significant reduction in disability related to shoulder pain  Baseline: 34.1% Goal status: INITIAL  2.  Pt will decrease worst shoulder pain by at least 3 points on the NPRS in order to demonstrate clinically significant reduction in shoulder pain. Baseline: 12/19/23: 4/10; Goal status: INITIAL  3.  Pt will increase L shoulder flexion, abduction, and ER to within 10 degrees of R side in order to improve functional overhead motion so he can complete all household  responsibilities and exercise/golf without pain;     Baseline: see note for measurements Goal status: INITIAL  4. Pt will increase L shoulder pain-free abduction strength to 5/5 MMT grade in order lift weights and golf without pain.         Baseline: 4/5 with pain; Goal status: INITIAL   PLAN: PT FREQUENCY: 1-2x/week  PT DURATION: 8 weeks  PLANNED INTERVENTIONS: Therapeutic exercises, Therapeutic activity, Neuromuscular re-education, Balance training, Gait training, Patient/Family education, Self Care, Joint mobilization, Joint manipulation, Vestibular training, Canalith repositioning, Orthotic/Fit training, DME instructions, Dry Needling, Electrical stimulation, Spinal manipulation, Spinal mobilization, Cryotherapy, Moist heat, Taping, Traction, Ultrasound, Ionotophoresis 4mg /ml Dexamethasone , Manual therapy, and Re-evaluation.  PLAN FOR NEXT SESSION: progress ROM and strength, manual techniques as needed. Progress/revise HEP as necessary;   Rayshad Riviello D Markevious Ehmke PT, DPT, GCS  Noralyn Karim, PT 12/26/2023, 1:12 PM

## 2023-12-26 ENCOUNTER — Ambulatory Visit

## 2023-12-26 DIAGNOSIS — G8929 Other chronic pain: Secondary | ICD-10-CM | POA: Insufficient documentation

## 2023-12-26 DIAGNOSIS — M25512 Pain in left shoulder: Secondary | ICD-10-CM | POA: Insufficient documentation

## 2023-12-27 NOTE — Therapy (Signed)
 OUTPATIENT PHYSICAL THERAPY SHOULDER/ELBOW TREATMENT  Patient Name: Reginald Yoder MRN: [id number] DOB:1980/03/22, 43 y.o., male Today's Date: 12/28/2023  END OF SESSION:  PT End of Session - 12/28/23 0850     Visit Number 4    Number of Visits 17    Date for Recertification  02/07/24    Authorization Type eval: 12/13/23    PT Start Time 0847    PT Stop Time 0930    PT Time Calculation (min) 43 min    Activity Tolerance Patient tolerated treatment well    Behavior During Therapy [facility] for tasks assessed/performed         Past Medical History:  Diagnosis Date   Allergy    GERD (gastroesophageal reflux disease)    Past Surgical History:  Procedure Laterality Date   TONSILLECTOMY Bilateral 1987   VASECTOMY  2017   WISDOM TOOTH EXTRACTION Bilateral 2002   Patient Active Problem List   Diagnosis Date Noted   Right inguinal hernia 12/15/2020   Partial retinal tear of right eye without detachment 07/14/2019   Gastroesophageal reflux disease 07/03/2019   Environmental and seasonal allergies 07/03/2019   PCP: Dr. Edman   REFERRING PROVIDER: Dr. Edman   REFERRING DIAG:  M75.42 (ICD-10-CM) - Rotator cuff impingement syndrome of left shoulder  M25.512,G89.29 (ICD-10-CM) - Chronic left shoulder pain   RATIONALE FOR EVALUATION AND TREATMENT: Rehabilitation  THERAPY DIAG: Chronic left shoulder pain  ONSET DATE: Approximately 2 months ago  FOLLOW-UP APPT SCHEDULED WITH REFERRING PROVIDER: No   FROM INITIAL EVALUATION SUBJECTIVE:                                                                                                                                                                                         SUBJECTIVE STATEMENT:  L shoulder pain  PERTINENT HISTORY:  Pt referred for L shoulder pain which started approximately 2 months ago. At that time he was weight lifting approximately 3 days per week. He would exercise on alternating days doing a general  full body workout and would typically perform 3-5 sets of 5-6 reps for each exercises. He was having pain with all pressing-type movements and stopped lifting but the pain never resolved. Initially he was experiencing tingling in the L hand (digits 4 and 5). Pt also complains of L sided neck pain which he notices pain with cross body reaching especially wit his arm at shoulder height. No pain when reaching up his back. He has been working on ROM and resting since seeing Dr. Edman. Symptoms progressively improved initially but he has had two re-aggravating episodes.  He gets occasional L shoulder popping which offers occasional relief.  He has a history of right shoulder AC joint injury sustained during a football game as a teenager. No prior injuries to the left shoulder. He also reports Intermittent neck stiffness with an episode of a neck strain 5-6 months ago.    PAIN:  Pain Intensity: Present: 0/10, Best: 0/10, Worst: 4/10 Pain location: Lateral L shoulder Pain Quality: achy with intermittent sharp pain Radiating: Yes, possible radiation down posterior arm through tricep Numbness/Tingling: Yes, initially numbness in digits 4 and 5 of L hand but not currently Focal Weakness: No Aggravating factors: pressing motions (esp. overhead/incline), horizontal abduction, cross body horizontal adduction at shoulder height Relieving factors: Rest, heat, Naproxen  (he took it for 2 weeks with notable improvement) 24-hour pain behavior: activity dependent History of prior shoulder or neck/shoulder injury, pain, surgery, or therapy: No Dominant hand: right (also plays golf right handed); Imaging: No  Red flags: Negative for personal history of cancer, chills/fever, night sweats, nausea, vomiting, unrelenting pain, unexplained weight gain/loss, chest pain/palpitations, SOB  PRECAUTIONS: None  WEIGHT BEARING RESTRICTIONS: No  Living Environment Lives with: lives with their family, wife and three  kids  Prior level of function: Independent  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  Recent memory is intact.  Remote memory is intact.  Attention span and concentration are intact.  Expressive speech is intact.  Patient's fund of knowledge is within normal limits for educational level.    Gross Musculoskeletal Assessment Tremor: None Bulk: Normal Tone: Normal  Gait Deferred  Posture No gross deficits noted  Cervical Screen AROM: WFL and painless with overpressure in all planes Spurlings A (ipsilateral lateral flexion/axial compression): R: Negative L: Negative Spurlings B (ipsilateral lateral flexion/contralateral rotation/axial compression): R: Negative L: Negative Hoffman Sign (cervical cord compression): R: Positive L: Positive ULTT: Attempted but unable to perform due to pain initially with shoulder depression;  Accessory Motions/Glides Glenohumeral: Posterior: R: normal L: Hypomobile Inferior: R: normal L: Hypomobile Anterior: R: not examined L: not examined  Acromioclavicular:  Posterior: R: not examined L: normal Anterior: R: not examined L: normal  Sternoclavicular: Posterior: R: not examined L: normal Anterior: R: not examined L: normal Superior: R: not examined L: normal Inferior: R: not examined L: normal  Scapulothoracic: Distraction: R: not examined L: not examined Medial: R: not examined L: normal Lateral: R: not examined L: normal Inferior: R: not examined L: normal Superior: R: not examined L: normal Upward Rotation: R: not examined L: normal Downward Rotation: R: not examined L: normal  AROM AROM (Normal range in  degrees) AROM  Cervical  Flexion (50) WNL  Extension (80) WNL  Right lateral flexion (45) WNL  Left lateral flexion (45) WNL  Right rotation (85) WNL  Left rotation (85) WNL   Right Left  Shoulder    Flexion 171 155*  Extension    Abduction 184 98*  External Rotation 92 70*  Internal Rotation 55 50*  Hands Behind Head    Hands Behind Back        Elbow    Flexion WNL WNL  Extension WNL WNL  Pronation    Supination    (* = pain; Blank rows = not tested)  Painful  arc in flexion starts at 80 degrees; Painful arc in abduction starts at 77 degrees;  UE MMT: MMT (out of 5) Right Left   Cervical (isometric)  Flexion WNL  Extension WNL  Lateral Flexion WNL WNL  Rotation WNL WNL      Shoulder   Flexion 5 5*  Extension    Abduction 5 4*  External rotation 5 5  Internal rotation 5 5  Horizontal abduction    Horizontal adduction    Lower Trapezius    Rhomboids        Elbow  Flexion 5 5  Extension 5 5  Pronation 5 5  Supination 5 5*      Wrist  Flexion 5 5  Extension 5 5      MCP  Flexion 5 5  Extension 5 5  Abduction 5 5  Adduction 5 5  (* = pain; Blank rows = not tested)  Hand behind back: R: T6  L: T1 Hand behind head: R: T7 L: T12   UE MMT: MMT (out of 5) Right Left   Shoulder   Extension 4 4  Horizontal abduction 4 4  Horizontal adduction    Lower Trapezius 4 Unable to test due to pain  Rhomboids 5 5  (* = pain; Blank rows = not tested)  Sensation Grossly intact to light touch bilateral UE as determined by testing dermatomes C2-T2. Proprioception and hot/cold testing deferred on this date.  Reflexes Deferred  Palpation Location LEFT  RIGHT           Subocciptials    Cervical paraspinals    Upper Trapezius    Levator Scapulae    Rhomboid Major/Minor    Sternoclavicular joint    Acromioclavicular joint 0   Coracoid process    Long head of biceps 1   Supraspinatus 0   Infraspinatus 0   Subscapularis 0   Teres Minor 0   Teres  Major    Pectoralis Major    Pectoralis Minor    Anterior Deltoid 1   Lateral Deltoid 1   Posterior Deltoid    Latissimus Dorsi    Sternocleidomastoid    (Blank rows = not tested) Graded on 0-4 scale (0 = no pain, 1 = pain, 2 = pain with wincing/grimacing/flinching, 3 = pain with withdrawal, 4 = unwilling to allow palpation), (Blank rows = not tested)  Accessory Motions/Glides Pt denies reproduction of shoulder pain with CPA C2-T7 and UPA bilaterally C2-T7. Intermittent hypomobility throughout thoracic spine but not reproduction of symptoms  SPECIAL TESTS Rotator Cuff  Drop Arm Test: Negative Painful Arc (Pain from 60 to 120 degrees scaption): Positive Infraspinatus Muscle Test: Negative If all 3 tests positive, the probability of a full-thickness rotator cuff tear is 91%  Subacromial Impingement Hawkins-Kennedy: Positive Neer (Block scapula, PROM flexion): Positive Painful Arc (Pain from 60 to 120 degrees scaption): Positive Empty Can: Negative External Rotation Resistance: Negative Horizontal Adduction: Not examined Scapular Assist: Positive  Labral Tear Biceps Load II (120 elevation, full ER, 90 elbow flexion, full supination, resisted elbow flexion): Not examined Crank (160 scaption, axial load with IR/ER): Not examined O'Briens/Active Compression Test (90 shoulder flexion, 10 adduction, full IR): Not examined  Bicep Tendon Pathology Speed (shoulder flexion to 90, external rotation, full elbow extension, and forearm supination with resistance: Negative Yergason's (resisted shoulder ER and supination/biceps tendon pathology): Not examined  Shoulder Instability Sulcus Sign: Negative Anterior Apprehension: Negative  Beighton scale Deferred   TODAY'S TREATMENT    SUBJECTIVE: Pt  reports that he is doing well today. HEP is going well but the Ws are painful. Denies resting pain upon arrival today. No specific questions or concerns.    PAIN: Denies resting  pain   Ther-ex  UBE x 5 minutes (2.5 forward/2.5 backward) for UE ROM and strengthening; Supine serratus punch with manual resistance from therapist x 10; R sidelying L shoulder ER with 4# DB 2 x 10, pain at end of range so encouraged to stay out of painful range; R sidelying L shoulder scation with 4# DB, regressed to 2# DB x 10, pain at end of range so encouraged to stay out of painful range; HEP updated and reviewed with patient;    Manual Therapy   Prone T3-T7 CPA, grade III, 30s/bout x 1 bouts at each level;  Supine L GH AP mobilizations at neutral, grade III, 30s/bout x 2 bouts; Supine L GH AP mobilizations at 90 abduction and available end range ER, grade III, 30s/bout x 2 bouts; Supine L GH inferior mobilizations at 90 abduction, grade III, 30s/bout x 2 bouts; Extensive STM to L rhomboids, subscapularis, and infraspinatus, improved motion afterward;   Not performed: Prone shoulder extension 3# DB x 15, 4# DB x 15; Prone mid trap high row 4# DB 2 x 15; Prone Y, unweighted x 15, 2# DB x 15; Supine serratus punch with 4# DB 2 x 15; Wall push-up plus x 10; Wall slide with low trap lift off, green tband x 10; Standing W's with green tband x 10;   PATIENT EDUCATION:  Education details: Findings, plan of care, and updated HEP; Person educated: Patient Education method: Explanation, Demonstration, and Handouts Education comprehension: verbalized understanding   HOME EXERCISE PROGRAM:  Access Code: [email or web address] URL: [email or web address] Date: 12/28/2023 Prepared by: Selinda Eck  Exercises - Seated Shoulder Abduction Towel Slide at Table Top with Forearm in Neutral  - 2 x daily - 7 x weekly - 3-5 reps - 30-45s hold - Seated Shoulder Flexion Slide at Table Top with Forearm in Neutral  - 2 x daily - 7 x weekly - 3-5 reps - 30-45s hold - Supine Static Chest Stretch on Foam Roll  - 2 x daily - 7 x weekly - 3-5 reps - 30-45s hold - Supine Chest Stretch with Elbows  Bent  - 2 x daily - 7 x weekly - 3-5 reps - 30-45s hold - Thoracic Extension Mobilization on Foam Roll  - 2 x daily - 7 x weekly - 2 sets - 10 reps - 3-5s hold - Standing Shoulder Row with Anchored Resistance  - 1 x daily - 7 x weekly - 2 sets - 10 reps - 3-5s hold - Standing Low Trap Setting with Resistance at Wall  - 1 x daily - 7 x weekly - 2 sets - 10 reps - Shoulder External Rotation with Anchored Resistance  - 1 x daily - 7 x weekly - 2 sets - 10 reps - 3s hold   ASSESSMENT:  CLINICAL IMPRESSION: Progressed manual techniques to improve L shoulder ROM as well as strengthening. Added focused ER strengthening which causes pain at end range shortening. STM focused on posterior shoulder musculature. Improving L shoulder flexion at end of session. Updated HEP and reviewed with patient. Pt encouraged to follow-up as scheduled. He will benefit from PT services to address deficits in ROM, strength, and pain in order to improve pain-free function at home and during exercise.  OBJECTIVE IMPAIRMENTS: decreased ROM, decreased strength, and pain.   ACTIVITY LIMITATIONS:  carrying, lifting, reach over head, and caring for others  PARTICIPATION LIMITATIONS: community activity and exercise and golf  PERSONAL FACTORS: Time since onset of injury/illness/exacerbation are also affecting patient's functional outcome.   REHAB POTENTIAL: Excellent  CLINICAL DECISION MAKING: Stable/uncomplicated  EVALUATION COMPLEXITY: Low   GOALS: Goals reviewed with patient? Yes  SHORT TERM GOALS: Target date: 01/10/2024  Pt will be independent with HEP to improve strength and decrease shoulder pain to improve pain-free function at home and when exercising/playing golf Baseline:  Goal status: INITIAL   LONG TERM GOALS: Target date: 02/07/2024  Pt will decrease quick DASH score by at least 8% in order to demonstrate clinically significant reduction in disability related to shoulder pain  Baseline: 34.1% Goal  status: INITIAL  2.  Pt will decrease worst shoulder pain by at least 3 points on the NPRS in order to demonstrate clinically significant reduction in shoulder pain. Baseline: 12/19/23: 4/10; Goal status: INITIAL  3.  Pt will increase L shoulder flexion, abduction, and ER to within 10 degrees of R side in order to improve functional overhead motion so he can complete all household responsibilities and exercise/golf without pain;     Baseline: see note for measurements Goal status: INITIAL  4. Pt will increase L shoulder pain-free abduction strength to 5/5 MMT grade in order lift weights and golf without pain.         Baseline: 4/5 with pain; Goal status: INITIAL   PLAN: PT FREQUENCY: 1-2x/week  PT DURATION: 8 weeks  PLANNED INTERVENTIONS: Therapeutic exercises, Therapeutic activity, Neuromuscular re-education, Balance training, Gait training, Patient/Family education, Self Care, Joint mobilization, Joint manipulation, Vestibular training, Canalith repositioning, Orthotic/Fit training, DME instructions, Dry Needling, Electrical stimulation, Spinal manipulation, Spinal mobilization, Cryotherapy, Moist heat, Taping, Traction, Ultrasound, Ionotophoresis 4mg /ml Dexamethasone , Manual therapy, and Re-evaluation.  PLAN FOR NEXT SESSION: progress ROM and strength, manual techniques as needed. Progress/revise HEP as necessary;   Jalan Bodi D Tesslyn Baumert PT, DPT, GCS  Vada Yellen, PT 12/28/2023, 10:06 AM

## 2023-12-28 ENCOUNTER — Ambulatory Visit

## 2023-12-28 DIAGNOSIS — G8929 Other chronic pain: Secondary | ICD-10-CM | POA: Diagnosis not present

## 2023-12-28 DIAGNOSIS — M25512 Pain in left shoulder: Secondary | ICD-10-CM | POA: Diagnosis not present

## 2024-01-01 NOTE — Therapy (Signed)
 OUTPATIENT PHYSICAL THERAPY SHOULDER/ELBOW TREATMENT  Patient Name: Reginald Yoder MRN: [id number] DOB:03/11/1980, 43 y.o., male Today's Date: 01/02/2024  END OF SESSION:  PT End of Session - 01/02/24 0839     Visit Number 5    Number of Visits 17    Date for Recertification  02/07/24    Authorization Type eval: 12/13/23    PT Start Time 0845    PT Stop Time 0926    PT Time Calculation (min) 41 min    Activity Tolerance Patient tolerated treatment well    Behavior During Therapy [facility] for tasks assessed/performed          Past Medical History:  Diagnosis Date   Allergy    GERD (gastroesophageal reflux disease)    Past Surgical History:  Procedure Laterality Date   TONSILLECTOMY Bilateral 1987   VASECTOMY  2017   WISDOM TOOTH EXTRACTION Bilateral 2002   Patient Active Problem List   Diagnosis Date Noted   Right inguinal hernia 12/15/2020   Partial retinal tear of right eye without detachment 07/14/2019   Gastroesophageal reflux disease 07/03/2019   Environmental and seasonal allergies 07/03/2019   PCP: Dr. Edman   REFERRING PROVIDER: Dr. Edman   REFERRING DIAG:  M75.42 (ICD-10-CM) - Rotator cuff impingement syndrome of left shoulder  M25.512,G89.29 (ICD-10-CM) - Chronic left shoulder pain   RATIONALE FOR EVALUATION AND TREATMENT: Rehabilitation  THERAPY DIAG: Chronic left shoulder pain  ONSET DATE: Approximately 2 months ago  FOLLOW-UP APPT SCHEDULED WITH REFERRING PROVIDER: No   FROM INITIAL EVALUATION SUBJECTIVE:                                                                                                                                                                                         SUBJECTIVE STATEMENT:  L shoulder pain  PERTINENT HISTORY:  Pt referred for L shoulder pain which started approximately 2 months ago. At that time he was weight lifting approximately 3 days per week. He would exercise on alternating days doing a  general full body workout and would typically perform 3-5 sets of 5-6 reps for each exercises. He was having pain with all pressing-type movements and stopped lifting but the pain never resolved. Initially he was experiencing tingling in the L hand (digits 4 and 5). Pt also complains of L sided neck pain which he notices pain with cross body reaching especially wit his arm at shoulder height. No pain when reaching up his back. He has been working on ROM and resting since seeing Dr. Edman. Symptoms progressively improved initially but he has had two re-aggravating episodes.  He gets occasional L shoulder popping which offers occasional  relief. He has a history of right shoulder AC joint injury sustained during a football game as a teenager. No prior injuries to the left shoulder. He also reports Intermittent neck stiffness with an episode of a neck strain 5-6 months ago.    PAIN:  Pain Intensity: Present: 0/10, Best: 0/10, Worst: 4/10 Pain location: Lateral L shoulder Pain Quality: achy with intermittent sharp pain Radiating: Yes, possible radiation down posterior arm through tricep Numbness/Tingling: Yes, initially numbness in digits 4 and 5 of L hand but not currently Focal Weakness: No Aggravating factors: pressing motions (esp. overhead/incline), horizontal abduction, cross body horizontal adduction at shoulder height Relieving factors: Rest, heat, Naproxen  (he took it for 2 weeks with notable improvement) 24-hour pain behavior: activity dependent History of prior shoulder or neck/shoulder injury, pain, surgery, or therapy: No Dominant hand: right (also plays golf right handed); Imaging: No  Red flags: Negative for personal history of cancer, chills/fever, night sweats, nausea, vomiting, unrelenting pain, unexplained weight gain/loss, chest pain/palpitations, SOB  PRECAUTIONS: None  WEIGHT BEARING RESTRICTIONS: No  Living Environment Lives with: lives with their family, wife and three  kids  Prior level of function: Independent  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  Recent memory is intact.  Remote memory is intact.  Attention span and concentration are intact.  Expressive speech is intact.  Patient's fund of knowledge is within normal limits for educational level.    Gross Musculoskeletal Assessment Tremor: None Bulk: Normal Tone: Normal  Gait Deferred  Posture No gross deficits noted  Cervical Screen AROM: WFL and painless with overpressure in all planes Spurlings A (ipsilateral lateral flexion/axial compression): R: Negative L: Negative Spurlings B (ipsilateral lateral flexion/contralateral rotation/axial compression): R: Negative L: Negative Hoffman Sign (cervical cord compression): R: Positive L: Positive ULTT: Attempted but unable to perform due to pain initially with shoulder depression;  Accessory Motions/Glides Glenohumeral: Posterior: R: normal L: Hypomobile Inferior: R: normal L: Hypomobile Anterior: R: not examined L: not examined  Acromioclavicular:  Posterior: R: not examined L: normal Anterior: R: not examined L: normal  Sternoclavicular: Posterior: R: not examined L: normal Anterior: R: not examined L: normal Superior: R: not examined L: normal Inferior: R: not examined L: normal  Scapulothoracic: Distraction: R: not examined L: not examined Medial: R: not examined L: normal Lateral: R: not examined L: normal Inferior: R: not examined L: normal Superior: R: not examined L: normal Upward Rotation: R: not examined L: normal Downward Rotation: R: not examined L: normal  AROM AROM (Normal range in  degrees) AROM  Cervical  Flexion (50) WNL  Extension (80) WNL  Right lateral flexion (45) WNL  Left lateral flexion (45) WNL  Right rotation (85) WNL  Left rotation (85) WNL   Right Left  Shoulder    Flexion 171 155*  Extension    Abduction 184 98*  External Rotation 92 70*  Internal Rotation 55 50*  Hands Behind Head    Hands Behind Back        Elbow    Flexion WNL WNL  Extension WNL WNL  Pronation    Supination    (* = pain; Blank rows = not tested)  Painful arc in flexion starts at 80 degrees; Painful arc in abduction starts at 77 degrees;  UE MMT: MMT (out of 5) Right Left   Cervical (isometric)  Flexion WNL  Extension WNL  Lateral Flexion WNL WNL  Rotation WNL WNL      Shoulder   Flexion 5 5*  Extension    Abduction 5 4*  External rotation 5 5  Internal rotation 5 5  Horizontal abduction    Horizontal adduction    Lower Trapezius    Rhomboids        Elbow  Flexion 5 5  Extension 5 5  Pronation 5 5  Supination 5 5*      Wrist  Flexion 5 5  Extension 5 5      MCP  Flexion 5 5  Extension 5 5  Abduction 5 5  Adduction 5 5  (* = pain; Blank rows = not tested)  Hand behind back: R: T6  L: T1 Hand behind head: R: T7 L: T12   UE MMT: MMT (out of 5) Right Left   Shoulder   Extension 4 4  Horizontal abduction 4 4  Horizontal adduction    Lower Trapezius 4 Unable to test due to pain  Rhomboids 5 5  (* = pain; Blank rows = not tested)  Sensation Grossly intact to light touch bilateral UE as determined by testing dermatomes C2-T2. Proprioception and hot/cold testing deferred on this date.  Reflexes Deferred  Palpation Location LEFT  RIGHT           Subocciptials    Cervical paraspinals    Upper Trapezius    Levator Scapulae    Rhomboid Major/Minor    Sternoclavicular joint    Acromioclavicular joint 0   Coracoid process    Long head of biceps 1   Supraspinatus 0   Infraspinatus 0   Subscapularis 0   Teres Minor 0   Teres  Major    Pectoralis Major    Pectoralis Minor    Anterior Deltoid 1   Lateral Deltoid 1   Posterior Deltoid    Latissimus Dorsi    Sternocleidomastoid    (Blank rows = not tested) Graded on 0-4 scale (0 = no pain, 1 = pain, 2 = pain with wincing/grimacing/flinching, 3 = pain with withdrawal, 4 = unwilling to allow palpation), (Blank rows = not tested)  Accessory Motions/Glides Pt denies reproduction of shoulder pain with CPA C2-T7 and UPA bilaterally C2-T7. Intermittent hypomobility throughout thoracic spine but not reproduction of symptoms  SPECIAL TESTS Rotator Cuff  Drop Arm Test: Negative Painful Arc (Pain from 60 to 120 degrees scaption): Positive Infraspinatus Muscle Test: Negative If all 3 tests positive, the probability of a full-thickness rotator cuff tear is 91%  Subacromial Impingement Hawkins-Kennedy: Positive Neer (Block scapula, PROM flexion): Positive Painful Arc (Pain from 60 to 120 degrees scaption): Positive Empty Can: Negative External Rotation Resistance: Negative Horizontal Adduction: Not examined Scapular Assist: Positive  Labral Tear Biceps Load II (120 elevation, full ER, 90 elbow flexion, full supination, resisted elbow flexion): Not examined Crank (160 scaption, axial load with IR/ER): Not examined O'Briens/Active Compression Test (90 shoulder flexion, 10 adduction, full IR): Not examined  Bicep Tendon Pathology Speed (shoulder flexion to 90, external rotation, full elbow extension, and forearm supination with resistance: Negative Yergason's (resisted shoulder ER and supination/biceps tendon pathology): Not examined  Shoulder Instability Sulcus Sign: Negative Anterior Apprehension: Negative  Beighton scale Deferred   TODAY'S TREATMENT: 01/02/24  SUBJECTIVE: Denies  resting pain upon arrival today. No specific questions or concerns.   PAIN: Denies resting pain  Ther-ex  UBE x 5 minutes (2.5 forward/2.5 backward) for UE ROM and  strengthening; Supine serratus punch with 4# DB on L 3 x 10; R sidelying L shoulder ER with 5# DB 3 x 10, pain at end of range so encouraged to stay out of painful range; Prone L shoulder scation 3 x 10 - within pain tolerance (no weight) Wall clocks with YTB around wrists (12, 3, 5 o'clock) 2 x 10  Standing forward B shoulder flexion with foam roll on wall and YTB around wrists x 10   Manual Therapy   Prone T3-T7 CPA, grade III, 30s/bout x 1 bouts at each level;  Supine L GH AP mobilizations at neutral, grade III, 30s/bout x 2 bouts; Supine L GH AP mobilizations at 90 abduction and available end range ER, grade III, 30s/bout x 2 bouts; Supine L GH inferior mobilizations at 90 abduction, grade III, 30s/bout x 2 bouts; Supine L AC joint grade III mobilizations in neutral position 30s/bout x 2-3 bouts Extensive STM to L rhomboids, subscapularis, and infraspinatus, improved motion afterward;  Not performed: Prone shoulder extension 3# DB x 15, 4# DB x 15; Prone mid trap high row 4# DB 2 x 15; Prone Y, unweighted x 15, 2# DB x 15; Supine serratus punch with 4# DB 2 x 15; Wall push-up plus x 10; Wall slide with low trap lift off, green tband x 10; Standing W's with green tband x 10;   PATIENT EDUCATION:  Education details: Findings, plan of care, and updated HEP; Person educated: Patient Education method: Explanation, Demonstration, and Handouts Education comprehension: verbalized understanding   HOME EXERCISE PROGRAM:  Access Code: [email or web address] URL: [email or web address] Date: 12/28/2023 Prepared by: Selinda Eck  Exercises - Seated Shoulder Abduction Towel Slide at Table Top with Forearm in Neutral  - 2 x daily - 7 x weekly - 3-5 reps - 30-45s hold - Seated Shoulder Flexion Slide at Table Top with Forearm in Neutral  - 2 x daily - 7 x weekly - 3-5 reps - 30-45s hold - Supine Static Chest Stretch on Foam Roll  - 2 x daily - 7 x weekly - 3-5 reps - 30-45s hold - Supine  Chest Stretch with Elbows Bent  - 2 x daily - 7 x weekly - 3-5 reps - 30-45s hold - Thoracic Extension Mobilization on Foam Roll  - 2 x daily - 7 x weekly - 2 sets - 10 reps - 3-5s hold - Standing Shoulder Row with Anchored Resistance  - 1 x daily - 7 x weekly - 2 sets - 10 reps - 3-5s hold - Standing Low Trap Setting with Resistance at Wall  - 1 x daily - 7 x weekly - 2 sets - 10 reps - Shoulder External Rotation with Anchored Resistance  - 1 x daily - 7 x weekly - 2 sets - 10 reps - 3s hold   ASSESSMENT:  CLINICAL IMPRESSION:   Continued PT POC focused on L shoulder pain. Focus remained on manual techniques to improve L shoulder ROM as well as L shoulder strengthening and stability. Educated patient to stay within pain tolerance throughout exercises. Pt encouraged to follow-up as scheduled. He will benefit from PT services to address deficits in ROM, strength, and pain in order to improve pain-free function at home and during exercise.  OBJECTIVE IMPAIRMENTS: decreased ROM, decreased strength, and pain.   ACTIVITY LIMITATIONS: carrying, lifting,  reach over head, and caring for others  PARTICIPATION LIMITATIONS: community activity and exercise and golf  PERSONAL FACTORS: Time since onset of injury/illness/exacerbation are also affecting patient's functional outcome.   REHAB POTENTIAL: Excellent  CLINICAL DECISION MAKING: Stable/uncomplicated  EVALUATION COMPLEXITY: Low   GOALS: Goals reviewed with patient? Yes  SHORT TERM GOALS: Target date: 01/10/2024  Pt will be independent with HEP to improve strength and decrease shoulder pain to improve pain-free function at home and when exercising/playing golf Baseline:  Goal status: INITIAL   LONG TERM GOALS: Target date: 02/07/2024  Pt will decrease quick DASH score by at least 8% in order to demonstrate clinically significant reduction in disability related to shoulder pain  Baseline: 34.1% Goal status: INITIAL  2.  Pt will  decrease worst shoulder pain by at least 3 points on the NPRS in order to demonstrate clinically significant reduction in shoulder pain. Baseline: 12/19/23: 4/10; Goal status: INITIAL  3.  Pt will increase L shoulder flexion, abduction, and ER to within 10 degrees of R side in order to improve functional overhead motion so he can complete all household responsibilities and exercise/golf without pain;     Baseline: see note for measurements Goal status: INITIAL  4. Pt will increase L shoulder pain-free abduction strength to 5/5 MMT grade in order lift weights and golf without pain.         Baseline: 4/5 with pain; Goal status: INITIAL   PLAN: PT FREQUENCY: 1-2x/week  PT DURATION: 8 weeks  PLANNED INTERVENTIONS: Therapeutic exercises, Therapeutic activity, Neuromuscular re-education, Balance training, Gait training, Patient/Family education, Self Care, Joint mobilization, Joint manipulation, Vestibular training, Canalith repositioning, Orthotic/Fit training, DME instructions, Dry Needling, Electrical stimulation, Spinal manipulation, Spinal mobilization, Cryotherapy, Moist heat, Taping, Traction, Ultrasound, Ionotophoresis 4mg /ml Dexamethasone , Manual therapy, and Re-evaluation.  PLAN FOR NEXT SESSION: progress ROM and strength, manual techniques as needed. Progress/revise HEP as necessary;   Maryanne Finder, PT, DPT Physical Therapist - [facility]  [facility]  Maryanne DELENA Finder, PT 01/02/2024, 8:40 AM

## 2024-01-02 ENCOUNTER — Ambulatory Visit

## 2024-01-02 DIAGNOSIS — G8929 Other chronic pain: Secondary | ICD-10-CM

## 2024-01-02 DIAGNOSIS — M25512 Pain in left shoulder: Secondary | ICD-10-CM | POA: Diagnosis not present

## 2024-01-04 ENCOUNTER — Ambulatory Visit

## 2024-01-08 NOTE — Therapy (Signed)
 OUTPATIENT PHYSICAL THERAPY SHOULDER/ELBOW TREATMENT  Patient Name: Reginald Yoder MRN: [id number] DOB:01-30-1980, 43 y.o., male Today's Date: 01/09/2024  END OF SESSION:  PT End of Session - 01/09/24 0802     Visit Number 6    Number of Visits 17    Date for Recertification  02/07/24    Authorization Type eval: 12/13/23    PT Start Time 0803    PT Stop Time 0903    PT Time Calculation (min) 60 min    Activity Tolerance Patient tolerated treatment well    Behavior During Therapy [facility] for tasks assessed/performed         Past Medical History:  Diagnosis Date   Allergy    GERD (gastroesophageal reflux disease)    Past Surgical History:  Procedure Laterality Date   TONSILLECTOMY Bilateral 1987   VASECTOMY  2017   WISDOM TOOTH EXTRACTION Bilateral 2002   Patient Active Problem List   Diagnosis Date Noted   Right inguinal hernia 12/15/2020   Partial retinal tear of right eye without detachment 07/14/2019   Gastroesophageal reflux disease 07/03/2019   Environmental and seasonal allergies 07/03/2019   PCP: Dr. Edman   REFERRING PROVIDER: Dr. Edman   REFERRING DIAG:  M75.42 (ICD-10-CM) - Rotator cuff impingement syndrome of left shoulder  M25.512,G89.29 (ICD-10-CM) - Chronic left shoulder pain   RATIONALE FOR EVALUATION AND TREATMENT: Rehabilitation  THERAPY DIAG: Chronic left shoulder pain  ONSET DATE: Approximately 2 months ago  FOLLOW-UP APPT SCHEDULED WITH REFERRING PROVIDER: No   FROM INITIAL EVALUATION SUBJECTIVE:                                                                                                                                                                                         SUBJECTIVE STATEMENT:  L shoulder pain  PERTINENT HISTORY:  Pt referred for L shoulder pain which started approximately 2 months ago. At that time he was weight lifting approximately 3 days per week. He would exercise on alternating days doing a general  full body workout and would typically perform 3-5 sets of 5-6 reps for each exercises. He was having pain with all pressing-type movements and stopped lifting but the pain never resolved. Initially he was experiencing tingling in the L hand (digits 4 and 5). Pt also complains of L sided neck pain which he notices pain with cross body reaching especially wit his arm at shoulder height. No pain when reaching up his back. He has been working on ROM and resting since seeing Dr. Edman. Symptoms progressively improved initially but he has had two re-aggravating episodes.  He gets occasional L shoulder popping which offers occasional relief.  He has a history of right shoulder AC joint injury sustained during a football game as a teenager. No prior injuries to the left shoulder. He also reports Intermittent neck stiffness with an episode of a neck strain 5-6 months ago.    PAIN:  Pain Intensity: Present: 0/10, Best: 0/10, Worst: 4/10 Pain location: Lateral L shoulder Pain Quality: achy with intermittent sharp pain Radiating: Yes, possible radiation down posterior arm through tricep Numbness/Tingling: Yes, initially numbness in digits 4 and 5 of L hand but not currently Focal Weakness: No Aggravating factors: pressing motions (esp. overhead/incline), horizontal abduction, cross body horizontal adduction at shoulder height Relieving factors: Rest, heat, Naproxen  (he took it for 2 weeks with notable improvement) 24-hour pain behavior: activity dependent History of prior shoulder or neck/shoulder injury, pain, surgery, or therapy: No Dominant hand: right (also plays golf right handed); Imaging: No  Red flags: Negative for personal history of cancer, chills/fever, night sweats, nausea, vomiting, unrelenting pain, unexplained weight gain/loss, chest pain/palpitations, SOB  PRECAUTIONS: None  WEIGHT BEARING RESTRICTIONS: No  Living Environment Lives with: lives with their family, wife and three  kids  Prior level of function: Independent  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  Recent memory is intact.  Remote memory is intact.  Attention span and concentration are intact.  Expressive speech is intact.  Patient's fund of knowledge is within normal limits for educational level.    Gross Musculoskeletal Assessment Tremor: None Bulk: Normal Tone: Normal  Gait Deferred  Posture No gross deficits noted  Cervical Screen AROM: WFL and painless with overpressure in all planes Spurlings A (ipsilateral lateral flexion/axial compression): R: Negative L: Negative Spurlings B (ipsilateral lateral flexion/contralateral rotation/axial compression): R: Negative L: Negative Hoffman Sign (cervical cord compression): R: Positive L: Positive ULTT: Attempted but unable to perform due to pain initially with shoulder depression;  Accessory Motions/Glides Glenohumeral: Posterior: R: normal L: Hypomobile Inferior: R: normal L: Hypomobile Anterior: R: not examined L: not examined  Acromioclavicular:  Posterior: R: not examined L: normal Anterior: R: not examined L: normal  Sternoclavicular: Posterior: R: not examined L: normal Anterior: R: not examined L: normal Superior: R: not examined L: normal Inferior: R: not examined L: normal  Scapulothoracic: Distraction: R: not examined L: not examined Medial: R: not examined L: normal Lateral: R: not examined L: normal Inferior: R: not examined L: normal Superior: R: not examined L: normal Upward Rotation: R: not examined L: normal Downward Rotation: R: not examined L: normal  AROM AROM (Normal range in  degrees) AROM  Cervical  Flexion (50) WNL  Extension (80) WNL  Right lateral flexion (45) WNL  Left lateral flexion (45) WNL  Right rotation (85) WNL  Left rotation (85) WNL   Right Left  Shoulder    Flexion 171 155*  Extension    Abduction 184 98*  External Rotation 92 70*  Internal Rotation 55 50*  Hands Behind Head    Hands Behind Back        Elbow    Flexion WNL WNL  Extension WNL WNL  Pronation    Supination    (* = pain; Blank rows = not tested)  Painful  arc in flexion starts at 80 degrees; Painful arc in abduction starts at 77 degrees;  UE MMT: MMT (out of 5) Right Left   Cervical (isometric)  Flexion WNL  Extension WNL  Lateral Flexion WNL WNL  Rotation WNL WNL      Shoulder   Flexion 5 5*  Extension    Abduction 5 4*  External rotation 5 5  Internal rotation 5 5  Horizontal abduction    Horizontal adduction    Lower Trapezius    Rhomboids        Elbow  Flexion 5 5  Extension 5 5  Pronation 5 5  Supination 5 5*      Wrist  Flexion 5 5  Extension 5 5      MCP  Flexion 5 5  Extension 5 5  Abduction 5 5  Adduction 5 5  (* = pain; Blank rows = not tested)  Hand behind back: R: T6  L: T1 Hand behind head: R: T7 L: T12   UE MMT: MMT (out of 5) Right Left   Shoulder   Extension 4 4  Horizontal abduction 4 4  Horizontal adduction    Lower Trapezius 4 Unable to test due to pain  Rhomboids 5 5  (* = pain; Blank rows = not tested)  Sensation Grossly intact to light touch bilateral UE as determined by testing dermatomes C2-T2. Proprioception and hot/cold testing deferred on this date.  Reflexes Deferred  Palpation Location LEFT  RIGHT           Subocciptials    Cervical paraspinals    Upper Trapezius    Levator Scapulae    Rhomboid Major/Minor    Sternoclavicular joint    Acromioclavicular joint 0   Coracoid process    Long head of biceps 1   Supraspinatus 0   Infraspinatus 0   Subscapularis 0   Teres Minor 0   Teres  Major    Pectoralis Major    Pectoralis Minor    Anterior Deltoid 1   Lateral Deltoid 1   Posterior Deltoid    Latissimus Dorsi    Sternocleidomastoid    (Blank rows = not tested) Graded on 0-4 scale (0 = no pain, 1 = pain, 2 = pain with wincing/grimacing/flinching, 3 = pain with withdrawal, 4 = unwilling to allow palpation), (Blank rows = not tested)  Accessory Motions/Glides Pt denies reproduction of shoulder pain with CPA C2-T7 and UPA bilaterally C2-T7. Intermittent hypomobility throughout thoracic spine but not reproduction of symptoms  SPECIAL TESTS Rotator Cuff  Drop Arm Test: Negative Painful Arc (Pain from 60 to 120 degrees scaption): Positive Infraspinatus Muscle Test: Negative If all 3 tests positive, the probability of a full-thickness rotator cuff tear is 91%  Subacromial Impingement Hawkins-Kennedy: Positive Neer (Block scapula, PROM flexion): Positive Painful Arc (Pain from 60 to 120 degrees scaption): Positive Empty Can: Negative External Rotation Resistance: Negative Horizontal Adduction: Not examined Scapular Assist: Positive  Labral Tear Biceps Load II (120 elevation, full ER, 90 elbow flexion, full supination, resisted elbow flexion): Not examined Crank (160 scaption, axial load with IR/ER): Not examined O'Briens/Active Compression Test (90 shoulder flexion, 10 adduction, full IR): Not examined  Bicep Tendon Pathology Speed (shoulder flexion to 90, external rotation, full elbow extension, and forearm supination with resistance: Negative Yergason's (resisted shoulder ER and supination/biceps tendon pathology): Not examined  Shoulder Instability Sulcus Sign: Negative Anterior Apprehension: Negative  Beighton scale Deferred   TODAY'S TREATMENT: 01/09/2024   SUBJECTIVE: Denies  resting pain upon arrival today. No specific questions or concerns.    PAIN: Denies resting pain   Ther-ex  UBE x 5 minutes (2.5 forward/2.5 backward) for UE ROM and  strengthening; Seated Nautilus lat pull down 95# 2 x 15; Standing Nautilus L shoulder ER at waist 20# 2 x 15; Standing Nautilus L shoulder ER at shoulder 20# 2 x 15; Standing Nautilus L shoulder IR at waist 20# 2 x 15; Seated pulleys for flexion, scaption, and IR instructed; HEP updated and reviewed with patient;   Manual Therapy   Prone T3-T7 CPA, grade III, 30s/bout x 1 bouts at each level;  Supine T3-T7 thrust manipulation with multiple cavitations Supine L GH AP mobilizations at neutral, grade III, 30s/bout x 2 bouts; Supine L GH AP mobilizations at 90 abduction and available end range ER, grade III, 30s/bout x 2 bouts; Supine L GH inferior mobilizations at 90 abduction, grade III, 30s/bout x 2 bouts; Prone L GH anterior mobilizations to improve extension/IR, grade III, 30s/bout x 2 bouts; Extensive STM to L rhomboids, subscapularis, and infraspinatus;   Not performed: Prone shoulder extension 3# DB x 15, 4# DB x 15; Prone mid trap high row 4# DB 2 x 15; Prone Y, unweighted x 15, 2# DB x 15; Supine serratus punch with 4# DB 2 x 15; Wall push-up plus x 10; Wall slide with low trap lift off, green tband x 10; Standing W's with green tband x 10; Supine serratus punch with 4# DB on L 3 x 10; R sidelying L shoulder ER with 5# DB 3 x 10, pain at end of range so encouraged to stay out of painful range; Prone L shoulder scation 3 x 10 - within pain tolerance (no weight) Wall clocks with YTB around wrists (12, 3, 5 o'clock) 2 x 10  Standing forward B shoulder flexion with foam roll on wall and YTB around wrists x 10    PATIENT EDUCATION:  Education details: Findings, plan of care, and updated HEP; Person educated: Patient Education method: Explanation, Demonstration, and Handouts Education comprehension: verbalized understanding   HOME EXERCISE PROGRAM:  Access Code: [email or web address] URL: [email or web address] Date: 01/09/2024 Prepared by: Selinda Eck  Exercises -  Seated Shoulder Abduction Towel Slide at Table Top with Forearm in Neutral  - 2 x daily - 7 x weekly - 3-5 reps - 30-45s hold - Seated Shoulder Flexion Slide at Table Top with Forearm in Neutral  - 2 x daily - 7 x weekly - 3-5 reps - 30-45s hold - Supine Static Chest Stretch on Foam Roll  - 2 x daily - 7 x weekly - 3-5 reps - 30-45s hold - Supine Chest Stretch with Elbows Bent  - 2 x daily - 7 x weekly - 3-5 reps - 30-45s hold - Thoracic Extension Mobilization on Foam Roll  - 2 x daily - 7 x weekly - 2 sets - 10 reps - 3-5s hold - Standing Shoulder Row with Anchored Resistance  - 1 x daily - 7 x weekly - 2 sets - 10 reps - 3-5s hold - Standing Low Trap Setting with Resistance at Wall  - 1 x daily - 7 x weekly - 2 sets - 10 reps - Shoulder External Rotation with Anchored Resistance  - 1 x daily - 7 x weekly - 2 sets - 10 reps - 3s hold - Seated Shoulder Scaption AAROM with Pulley at Side  - 1-2 x daily - 7 x weekly - 2 sets - 10 reps -  Standing Shoulder Internal Rotation AAROM with Pulley (Mirrored)  - 1-2 x daily - 7 x weekly - 2 sets - 10 reps   ASSESSMENT:  CLINICAL IMPRESSION:   Continued PT POC focused on L shoulder pain. Focus remained on manual techniques to improve L shoulder ROM as well as L shoulder strengthening and stability. Educated patient to stay within pain tolerance throughout exercises. Updated HEP and issued pulleys to patient. Pt encouraged to follow-up as scheduled. He will benefit from PT services to address deficits in ROM, strength, and pain in order to improve pain-free function at home and during exercise.  OBJECTIVE IMPAIRMENTS: decreased ROM, decreased strength, and pain.   ACTIVITY LIMITATIONS: carrying, lifting, reach over head, and caring for others  PARTICIPATION LIMITATIONS: community activity and exercise and golf  PERSONAL FACTORS: Time since onset of injury/illness/exacerbation are also affecting patient's functional outcome.   REHAB POTENTIAL:  Excellent  CLINICAL DECISION MAKING: Stable/uncomplicated  EVALUATION COMPLEXITY: Low   GOALS: Goals reviewed with patient? Yes  SHORT TERM GOALS: Target date: 01/10/2024  Pt will be independent with HEP to improve strength and decrease shoulder pain to improve pain-free function at home and when exercising/playing golf Baseline:  Goal status: INITIAL   LONG TERM GOALS: Target date: 02/07/2024  Pt will decrease quick DASH score by at least 8% in order to demonstrate clinically significant reduction in disability related to shoulder pain  Baseline: 34.1% Goal status: INITIAL  2.  Pt will decrease worst shoulder pain by at least 3 points on the NPRS in order to demonstrate clinically significant reduction in shoulder pain. Baseline: 12/19/23: 4/10; Goal status: INITIAL  3.  Pt will increase L shoulder flexion, abduction, and ER to within 10 degrees of R side in order to improve functional overhead motion so he can complete all household responsibilities and exercise/golf without pain;     Baseline: see note for measurements Goal status: INITIAL  4. Pt will increase L shoulder pain-free abduction strength to 5/5 MMT grade in order lift weights and golf without pain.         Baseline: 4/5 with pain; Goal status: INITIAL   PLAN: PT FREQUENCY: 1-2x/week  PT DURATION: 8 weeks  PLANNED INTERVENTIONS: Therapeutic exercises, Therapeutic activity, Neuromuscular re-education, Balance training, Gait training, Patient/Family education, Self Care, Joint mobilization, Joint manipulation, Vestibular training, Canalith repositioning, Orthotic/Fit training, DME instructions, Dry Needling, Electrical stimulation, Spinal manipulation, Spinal mobilization, Cryotherapy, Moist heat, Taping, Traction, Ultrasound, Ionotophoresis 4mg /ml Dexamethasone , Manual therapy, and Re-evaluation.  PLAN FOR NEXT SESSION: progress ROM and strength, manual techniques as needed. Progress/revise HEP as  necessary;   Selinda JONETTA Eck PT, DPT, GCS  Physical Therapist - [facility]  [facility]  Marica Trentham, PT 01/09/2024, 9:19 AM

## 2024-01-09 ENCOUNTER — Ambulatory Visit

## 2024-01-09 DIAGNOSIS — G8929 Other chronic pain: Secondary | ICD-10-CM

## 2024-01-09 DIAGNOSIS — M25512 Pain in left shoulder: Secondary | ICD-10-CM | POA: Diagnosis not present

## 2024-01-11 ENCOUNTER — Ambulatory Visit

## 2024-01-15 ENCOUNTER — Ambulatory Visit

## 2024-01-17 ENCOUNTER — Ambulatory Visit

## 2024-01-22 ENCOUNTER — Ambulatory Visit

## 2024-01-23 NOTE — Therapy (Signed)
 " OUTPATIENT PHYSICAL THERAPY SHOULDER/ELBOW TREATMENT  Patient Name: Reginald Yoder MRN: [id number] DOB:1980-10-14, 43 y.o., male Today's Date: 01/24/2024  END OF SESSION:  PT End of Session - 01/24/24 0845     Visit Number 7    Number of Visits 17    Date for Recertification  02/07/24    Authorization Type eval: 12/13/23    PT Start Time 0845    PT Stop Time 0930    PT Time Calculation (min) 45 min    Activity Tolerance Patient tolerated treatment well    Behavior During Therapy [facility] for tasks assessed/performed         Past Medical History:  Diagnosis Date   Allergy    GERD (gastroesophageal reflux disease)    Past Surgical History:  Procedure Laterality Date   TONSILLECTOMY Bilateral 1987   VASECTOMY  2017   WISDOM TOOTH EXTRACTION Bilateral 2002   Patient Active Problem List   Diagnosis Date Noted   Right inguinal hernia 12/15/2020   Partial retinal tear of right eye without detachment 07/14/2019   Gastroesophageal reflux disease 07/03/2019   Environmental and seasonal allergies 07/03/2019   PCP: Dr. Edman   REFERRING PROVIDER: Dr. Edman   REFERRING DIAG:  M75.42 (ICD-10-CM) - Rotator cuff impingement syndrome of left shoulder  M25.512,G89.29 (ICD-10-CM) - Chronic left shoulder pain   RATIONALE FOR EVALUATION AND TREATMENT: Rehabilitation  THERAPY DIAG: Chronic left shoulder pain  ONSET DATE: Approximately 2 months ago  FOLLOW-UP APPT SCHEDULED WITH REFERRING PROVIDER: No   FROM INITIAL EVALUATION SUBJECTIVE:                                                                                                                                                                                         SUBJECTIVE STATEMENT:  L shoulder pain  PERTINENT HISTORY:  Pt referred for L shoulder pain which started approximately 2 months ago. At that time he was weight lifting approximately 3 days per week. He would exercise on alternating days doing a general  full body workout and would typically perform 3-5 sets of 5-6 reps for each exercises. He was having pain with all pressing-type movements and stopped lifting but the pain never resolved. Initially he was experiencing tingling in the L hand (digits 4 and 5). Pt also complains of L sided neck pain which he notices pain with cross body reaching especially wit his arm at shoulder height. No pain when reaching up his back. He has been working on ROM and resting since seeing Dr. Edman. Symptoms progressively improved initially but he has had two re-aggravating episodes.  He gets occasional L shoulder popping which offers occasional  relief. He has a history of right shoulder AC joint injury sustained during a football game as a teenager. No prior injuries to the left shoulder. He also reports Intermittent neck stiffness with an episode of a neck strain 5-6 months ago.    PAIN:  Pain Intensity: Present: 0/10, Best: 0/10, Worst: 4/10 Pain location: Lateral L shoulder Pain Quality: achy with intermittent sharp pain Radiating: Yes, possible radiation down posterior arm through tricep Numbness/Tingling: Yes, initially numbness in digits 4 and 5 of L hand but not currently Focal Weakness: No Aggravating factors: pressing motions (esp. overhead/incline), horizontal abduction, cross body horizontal adduction at shoulder height Relieving factors: Rest, heat, Naproxen  (he took it for 2 weeks with notable improvement) 24-hour pain behavior: activity dependent History of prior shoulder or neck/shoulder injury, pain, surgery, or therapy: No Dominant hand: right (also plays golf right handed); Imaging: No  Red flags: Negative for personal history of cancer, chills/fever, night sweats, nausea, vomiting, unrelenting pain, unexplained weight gain/loss, chest pain/palpitations, SOB  PRECAUTIONS: None  WEIGHT BEARING RESTRICTIONS: No  Living Environment Lives with: lives with their family, wife and three  kids  Prior level of function: Independent  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  Recent memory is intact.  Remote memory is intact.  Attention span and concentration are intact.  Expressive speech is intact.  Patient's fund of knowledge is within normal limits for educational level.    Gross Musculoskeletal Assessment Tremor: None Bulk: Normal Tone: Normal  Gait Deferred  Posture No gross deficits noted  Cervical Screen AROM: WFL and painless with overpressure in all planes Spurlings A (ipsilateral lateral flexion/axial compression): R: Negative L: Negative Spurlings B (ipsilateral lateral flexion/contralateral rotation/axial compression): R: Negative L: Negative Hoffman Sign (cervical cord compression): R: Positive L: Positive ULTT: Attempted but unable to perform due to pain initially with shoulder depression;  Accessory Motions/Glides Glenohumeral: Posterior: R: normal L: Hypomobile Inferior: R: normal L: Hypomobile Anterior: R: not examined L: not examined  Acromioclavicular:  Posterior: R: not examined L: normal Anterior: R: not examined L: normal  Sternoclavicular: Posterior: R: not examined L: normal Anterior: R: not examined L: normal Superior: R: not examined L: normal Inferior: R: not examined L: normal  Scapulothoracic: Distraction: R: not examined L: not examined Medial: R: not examined L: normal Lateral: R: not examined L: normal Inferior: R: not examined L: normal Superior: R: not examined L: normal Upward Rotation: R: not examined L: normal Downward Rotation: R: not examined L: normal  AROM AROM (Normal range in  degrees) AROM  Cervical  Flexion (50) WNL  Extension (80) WNL  Right lateral flexion (45) WNL  Left lateral flexion (45) WNL  Right rotation (85) WNL  Left rotation (85) WNL   Right Left  Shoulder    Flexion 171 155*  Extension    Abduction 184 98*  External Rotation 92 70*  Internal Rotation 55 50*  Hands Behind Head    Hands Behind Back        Elbow    Flexion WNL WNL  Extension WNL WNL  Pronation    Supination    (* = pain; Blank rows = not tested)  Painful arc in flexion starts at 80 degrees; Painful arc in abduction starts at 77 degrees;  UE MMT: MMT (out of 5) Right Left   Cervical (isometric)  Flexion WNL  Extension WNL  Lateral Flexion WNL WNL  Rotation WNL WNL      Shoulder   Flexion 5 5*  Extension    Abduction 5 4*  External rotation 5 5  Internal rotation 5 5  Horizontal abduction    Horizontal adduction    Lower Trapezius    Rhomboids        Elbow  Flexion 5 5  Extension 5 5  Pronation 5 5  Supination 5 5*      Wrist  Flexion 5 5  Extension 5 5      MCP  Flexion 5 5  Extension 5 5  Abduction 5 5  Adduction 5 5  (* = pain; Blank rows = not tested)  Hand behind back: R: T6  L: T1 Hand behind head: R: T7 L: T12   UE MMT: MMT (out of 5) Right Left   Shoulder   Extension 4 4  Horizontal abduction 4 4  Horizontal adduction    Lower Trapezius 4 Unable to test due to pain  Rhomboids 5 5  (* = pain; Blank rows = not tested)  Sensation Grossly intact to light touch bilateral UE as determined by testing dermatomes C2-T2. Proprioception and hot/cold testing deferred on this date.  Reflexes Deferred  Palpation Location LEFT  RIGHT           Subocciptials    Cervical paraspinals    Upper Trapezius    Levator Scapulae    Rhomboid Major/Minor    Sternoclavicular joint    Acromioclavicular joint 0   Coracoid process    Long head of biceps 1   Supraspinatus 0   Infraspinatus 0   Subscapularis 0   Teres Minor 0   Teres  Major    Pectoralis Major    Pectoralis Minor    Anterior Deltoid 1   Lateral Deltoid 1   Posterior Deltoid    Latissimus Dorsi    Sternocleidomastoid    (Blank rows = not tested) Graded on 0-4 scale (0 = no pain, 1 = pain, 2 = pain with wincing/grimacing/flinching, 3 = pain with withdrawal, 4 = unwilling to allow palpation), (Blank rows = not tested)  Accessory Motions/Glides Pt denies reproduction of shoulder pain with CPA C2-T7 and UPA bilaterally C2-T7. Intermittent hypomobility throughout thoracic spine but not reproduction of symptoms  SPECIAL TESTS Rotator Cuff  Drop Arm Test: Negative Painful Arc (Pain from 60 to 120 degrees scaption): Positive Infraspinatus Muscle Test: Negative If all 3 tests positive, the probability of a full-thickness rotator cuff tear is 91%  Subacromial Impingement Hawkins-Kennedy: Positive Neer (Block scapula, PROM flexion): Positive Painful Arc (Pain from 60 to 120 degrees scaption): Positive Empty Can: Negative External Rotation Resistance: Negative Horizontal Adduction: Not examined Scapular Assist: Positive  Labral Tear Biceps Load II (120 elevation, full ER, 90 elbow flexion, full supination, resisted elbow flexion): Not examined Crank (160 scaption, axial load with IR/ER): Not examined O'Briens/Active Compression Test (90 shoulder flexion, 10 adduction, full IR): Not examined  Bicep Tendon Pathology Speed (shoulder flexion to 90, external rotation, full elbow extension, and forearm supination with resistance: Negative Yergason's (resisted shoulder ER and supination/biceps tendon pathology): Not examined  Shoulder Instability Sulcus Sign: Negative Anterior Apprehension: Negative  Beighton scale Deferred   TODAY'S TREATMENT: 01/24/2024   SUBJECTIVE:  Denies resting pain upon arrival today. No specific questions or concerns.    PAIN: Denies resting pain   Ther-ex  UBE x 5 minutes (2.5 forward/2.5 backward) for UE ROM and  strengthening; Prone L shoulder extension with 4# DB 2 x 15; Prone LUE mid trap high row with 4# DB 2 x 15; Prone LUE low trap forward flexion with 4# DB 2 x 15; Prone L shoulder ER at 90 abduction with 4# DB 2 x 15;   Manual Therapy    AROM AROM (Normal range in degrees) AROM   Right Left  Shoulder    Flexion 169 158*  Extension    Abduction 182 150*  External Rotation 96 85*  Internal Rotation 58 52  Hands Behind Head    Hands Behind Back    (* = pain; Blank rows = not tested)  Supine L GH AP mobilizations at neutral, grade III, 30s/bout x 2 bouts; Supine L GH AP mobilizations at 90 abduction and available end range ER, grade III, 30s/bout x 2 bouts; Supine L GH inferior mobilizations at 90 abduction, grade III, 30s/bout x 2 bouts; Prone L GH anterior mobilizations to improve extension/IR, grade III, 30s/bout x 2 bouts; IASTM to L upper trap, posterior deltoid, infraspinatus, teres minor, and supraspinatus;   Not performed: Supine serratus punch with 4# DB 2 x 15; Wall push-up plus x 10; Wall slide with low trap lift off, green tband x 10; Standing W's with green tband x 10; Supine serratus punch with 4# DB on L 3 x 10; R sidelying L shoulder ER with 5# DB 3 x 10, pain at end of range so encouraged to stay out of painful range; Wall clocks with YTB around wrists (12, 3, 5 o'clock) 2 x 10  Standing forward B shoulder flexion with foam roll on wall and YTB around wrists x 10; Prone T3-T7 CPA, grade III, 30s/bout x 1 bouts at each level;  Supine T3-T7 thrust manipulation with multiple cavitations Seated Nautilus lat pull down 95# 2 x 15; Standing Nautilus L shoulder ER at waist 20# 2 x 15; Standing Nautilus L shoulder ER at shoulder 20# 2 x 15; Standing Nautilus L shoulder IR at waist 20# 2 x 15;   PATIENT EDUCATION:  Education details: Findings, plan of care, and updated HEP; Person educated: Patient Education method: Explanation, Demonstration, and  Handouts Education comprehension: verbalized understanding   HOME EXERCISE PROGRAM:  Access Code: [email or web address] URL: [email or web address] Date: 01/09/2024 Prepared by: Selinda Eck  Exercises - Seated Shoulder Abduction Towel Slide at Table Top with Forearm in Neutral  - 2 x daily - 7 x weekly - 3-5 reps - 30-45s hold - Seated Shoulder Flexion Slide at Table Top with Forearm in Neutral  - 2 x daily - 7 x weekly - 3-5 reps - 30-45s hold - Supine Static Chest Stretch on Foam Roll  - 2 x daily - 7 x weekly - 3-5 reps - 30-45s hold - Supine Chest Stretch with Elbows Bent  - 2 x daily - 7 x weekly - 3-5 reps - 30-45s hold - Thoracic Extension Mobilization on Foam Roll  - 2 x daily - 7 x weekly - 2 sets - 10 reps - 3-5s hold - Standing Shoulder Row with Anchored Resistance  - 1 x daily - 7 x weekly - 2 sets - 10 reps - 3-5s hold - Standing Low Trap Setting with Resistance at Wall  - 1 x daily - 7 x weekly - 2  sets - 10 reps - Shoulder External Rotation with Anchored Resistance  - 1 x daily - 7 x weekly - 2 sets - 10 reps - 3s hold - Seated Shoulder Scaption AAROM with Pulley at Side  - 1-2 x daily - 7 x weekly - 2 sets - 10 reps - Standing Shoulder Internal Rotation AAROM with Pulley (Mirrored)  - 1-2 x daily - 7 x weekly - 2 sets - 10 reps   ASSESSMENT:  CLINICAL IMPRESSION:   Continued PT POC focused on L shoulder pain. Measurements taken with notable improvement in ROM since last measurements, especially abduction. Focus remained on manual techniques to improve L shoulder ROM. Performed limited L shoulder strengthening today with greater focus with manual interventions given patients proficiency with HEP. No HEP updates at this time. Sessions spread out to every other week given progress. Pt encouraged to follow-up as scheduled. He will benefit from PT services to address deficits in ROM, strength, and pain in order to improve pain-free function at home and during  exercise.  OBJECTIVE IMPAIRMENTS: decreased ROM, decreased strength, and pain.   ACTIVITY LIMITATIONS: carrying, lifting, reach over head, and caring for others  PARTICIPATION LIMITATIONS: community activity and exercise and golf  PERSONAL FACTORS: Time since onset of injury/illness/exacerbation are also affecting patient's functional outcome.   REHAB POTENTIAL: Excellent  CLINICAL DECISION MAKING: Stable/uncomplicated  EVALUATION COMPLEXITY: Low   GOALS: Goals reviewed with patient? Yes  SHORT TERM GOALS: Target date: 01/10/2024  Pt will be independent with HEP to improve strength and decrease shoulder pain to improve pain-free function at home and when exercising/playing golf Baseline:  Goal status: INITIAL   LONG TERM GOALS: Target date: 02/07/2024  Pt will decrease quick DASH score by at least 8% in order to demonstrate clinically significant reduction in disability related to shoulder pain  Baseline: 34.1% Goal status: INITIAL  2.  Pt will decrease worst shoulder pain by at least 3 points on the NPRS in order to demonstrate clinically significant reduction in shoulder pain. Baseline: 12/19/23: 4/10; Goal status: INITIAL  3.  Pt will increase L shoulder flexion, abduction, and ER to within 10 degrees of R side in order to improve functional overhead motion so he can complete all household responsibilities and exercise/golf without pain;     Baseline: see note for measurements Goal status: INITIAL  4. Pt will increase L shoulder pain-free abduction strength to 5/5 MMT grade in order lift weights and golf without pain.         Baseline: 4/5 with pain; Goal status: INITIAL   PLAN: PT FREQUENCY: 1-2x/week  PT DURATION: 8 weeks  PLANNED INTERVENTIONS: Therapeutic exercises, Therapeutic activity, Neuromuscular re-education, Balance training, Gait training, Patient/Family education, Self Care, Joint mobilization, Joint manipulation, Vestibular training, Canalith  repositioning, Orthotic/Fit training, DME instructions, Dry Needling, Electrical stimulation, Spinal manipulation, Spinal mobilization, Cryotherapy, Moist heat, Taping, Traction, Ultrasound, Ionotophoresis 4mg /ml Dexamethasone , Manual therapy, and Re-evaluation.  PLAN FOR NEXT SESSION: progress ROM and strength, manual techniques as needed. Progress/revise HEP as necessary;   Selinda JONETTA Eck PT, DPT, GCS  Physical Therapist - [place]  [facility]  Gitel Beste, PT 01/24/2024, 10:09 AM  "

## 2024-01-24 ENCOUNTER — Ambulatory Visit

## 2024-01-24 DIAGNOSIS — M25512 Pain in left shoulder: Secondary | ICD-10-CM | POA: Diagnosis not present

## 2024-01-24 DIAGNOSIS — G8929 Other chronic pain: Secondary | ICD-10-CM

## 2024-01-30 ENCOUNTER — Ambulatory Visit

## 2024-02-01 ENCOUNTER — Ambulatory Visit

## 2024-02-06 ENCOUNTER — Ambulatory Visit: Attending: Family Medicine

## 2024-02-06 DIAGNOSIS — G8929 Other chronic pain: Secondary | ICD-10-CM | POA: Diagnosis present

## 2024-02-06 DIAGNOSIS — M25512 Pain in left shoulder: Secondary | ICD-10-CM | POA: Diagnosis present

## 2024-02-06 NOTE — Therapy (Signed)
 " OUTPATIENT PHYSICAL THERAPY SHOULDER/ELBOW TREATMENT  Patient Name: Reginald Yoder MRN: [id number] DOB:12-21-1980, 44 y.o., male Today's Date: 02/06/2024  END OF SESSION:  PT End of Session - 02/06/24 0807     Visit Number 8    Number of Visits 17    Date for Recertification  02/07/24    Authorization Type eval: 12/13/23    PT Start Time 0801    PT Stop Time 0845    PT Time Calculation (min) 44 min    Activity Tolerance Patient tolerated treatment well    Behavior During Therapy [facility] for tasks assessed/performed         Past Medical History:  Diagnosis Date   Allergy    GERD (gastroesophageal reflux disease)    Past Surgical History:  Procedure Laterality Date   TONSILLECTOMY Bilateral 1987   VASECTOMY  2017   WISDOM TOOTH EXTRACTION Bilateral 2002   Patient Active Problem List   Diagnosis Date Noted   Right inguinal hernia 12/15/2020   Partial retinal tear of right eye without detachment 07/14/2019   Gastroesophageal reflux disease 07/03/2019   Environmental and seasonal allergies 07/03/2019   PCP: Dr. Edman   REFERRING PROVIDER: Dr. Edman   REFERRING DIAG:  M75.42 (ICD-10-CM) - Rotator cuff impingement syndrome of left shoulder  M25.512,G89.29 (ICD-10-CM) - Chronic left shoulder pain   RATIONALE FOR EVALUATION AND TREATMENT: Rehabilitation  THERAPY DIAG: Chronic left shoulder pain  ONSET DATE: Approximately 2 months ago  FOLLOW-UP APPT SCHEDULED WITH REFERRING PROVIDER: No   FROM INITIAL EVALUATION SUBJECTIVE:                                                                                                                                                                                         SUBJECTIVE STATEMENT:  L shoulder pain  PERTINENT HISTORY:  Pt referred for L shoulder pain which started approximately 2 months ago. At that time he was weight lifting approximately 3 days per week. He would exercise on alternating days doing a general  full body workout and would typically perform 3-5 sets of 5-6 reps for each exercises. He was having pain with all pressing-type movements and stopped lifting but the pain never resolved. Initially he was experiencing tingling in the L hand (digits 4 and 5). Pt also complains of L sided neck pain which he notices pain with cross body reaching especially wit his arm at shoulder height. No pain when reaching up his back. He has been working on ROM and resting since seeing Dr. Edman. Symptoms progressively improved initially but he has had two re-aggravating episodes.  He gets occasional L shoulder popping which offers occasional  relief. He has a history of right shoulder AC joint injury sustained during a football game as a teenager. No prior injuries to the left shoulder. He also reports Intermittent neck stiffness with an episode of a neck strain 5-6 months ago.    PAIN:  Pain Intensity: Present: 0/10, Best: 0/10, Worst: 4/10 Pain location: Lateral L shoulder Pain Quality: achy with intermittent sharp pain Radiating: Yes, possible radiation down posterior arm through tricep Numbness/Tingling: Yes, initially numbness in digits 4 and 5 of L hand but not currently Focal Weakness: No Aggravating factors: pressing motions (esp. overhead/incline), horizontal abduction, cross body horizontal adduction at shoulder height Relieving factors: Rest, heat, Naproxen  (he took it for 2 weeks with notable improvement) 24-hour pain behavior: activity dependent History of prior shoulder or neck/shoulder injury, pain, surgery, or therapy: No Dominant hand: right (also plays golf right handed); Imaging: No  Red flags: Negative for personal history of cancer, chills/fever, night sweats, nausea, vomiting, unrelenting pain, unexplained weight gain/loss, chest pain/palpitations, SOB  PRECAUTIONS: None  WEIGHT BEARING RESTRICTIONS: No  Living Environment Lives with: lives with their family, wife and three  kids  Prior level of function: Independent  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  Recent memory is intact.  Remote memory is intact.  Attention span and concentration are intact.  Expressive speech is intact.  Patient's fund of knowledge is within normal limits for educational level.    Gross Musculoskeletal Assessment Tremor: None Bulk: Normal Tone: Normal  Gait Deferred  Posture No gross deficits noted  Cervical Screen AROM: WFL and painless with overpressure in all planes Spurlings A (ipsilateral lateral flexion/axial compression): R: Negative L: Negative Spurlings B (ipsilateral lateral flexion/contralateral rotation/axial compression): R: Negative L: Negative Hoffman Sign (cervical cord compression): R: Positive L: Positive ULTT: Attempted but unable to perform due to pain initially with shoulder depression;  Accessory Motions/Glides Glenohumeral: Posterior: R: normal L: Hypomobile Inferior: R: normal L: Hypomobile Anterior: R: not examined L: not examined  Acromioclavicular:  Posterior: R: not examined L: normal Anterior: R: not examined L: normal  Sternoclavicular: Posterior: R: not examined L: normal Anterior: R: not examined L: normal Superior: R: not examined L: normal Inferior: R: not examined L: normal  Scapulothoracic: Distraction: R: not examined L: not examined Medial: R: not examined L: normal Lateral: R: not examined L: normal Inferior: R: not examined L: normal Superior: R: not examined L: normal Upward Rotation: R: not examined L: normal Downward Rotation: R: not examined L: normal  AROM AROM (Normal range in  degrees) AROM  Cervical  Flexion (50) WNL  Extension (80) WNL  Right lateral flexion (45) WNL  Left lateral flexion (45) WNL  Right rotation (85) WNL  Left rotation (85) WNL   Right Left  Shoulder    Flexion 171 155*  Extension    Abduction 184 98*  External Rotation 92 70*  Internal Rotation 55 50*  Hands Behind Head    Hands Behind Back        Elbow    Flexion WNL WNL  Extension WNL WNL  Pronation    Supination    (* = pain; Blank rows = not tested)  Painful arc in flexion starts at 80 degrees; Painful arc in abduction starts at 77 degrees;  AROM (01/24/24) AROM (Normal range in degrees) AROM   Right Left  Shoulder    Flexion 169 158*  Extension    Abduction 182 150*  External Rotation 96 85*  Internal Rotation 58 52  Hands Behind Head    Hands Behind Back    (* = pain; Blank rows = not tested)  UE MMT: MMT (out of 5) Right Left   Cervical (isometric)  Flexion WNL  Extension WNL  Lateral Flexion WNL WNL  Rotation WNL WNL      Shoulder   Flexion 5 5*  Extension    Abduction 5 4*  External rotation 5 5  Internal rotation 5 5  Horizontal abduction    Horizontal adduction    Lower Trapezius    Rhomboids        Elbow  Flexion 5 5  Extension 5 5  Pronation 5 5  Supination 5 5*      Wrist  Flexion 5 5  Extension 5 5      MCP  Flexion 5 5  Extension 5 5  Abduction 5 5  Adduction 5 5  (* = pain; Blank rows = not tested)  Hand behind back: R: T6  L: T1 Hand behind head: R: T7 L: T12   UE MMT: MMT (out of 5) Right Left   Shoulder   Extension 4 4  Horizontal abduction 4 4  Horizontal adduction    Lower Trapezius 4 Unable to test due to pain  Rhomboids 5 5  (* = pain; Blank rows = not tested)  Sensation Grossly intact to light touch bilateral UE as determined by testing dermatomes C2-T2. Proprioception and hot/cold testing deferred on this date.  Reflexes Deferred  Palpation Location LEFT  RIGHT           Subocciptials     Cervical paraspinals    Upper Trapezius    Levator Scapulae    Rhomboid Major/Minor    Sternoclavicular joint    Acromioclavicular joint 0   Coracoid process    Long head of biceps 1   Supraspinatus 0   Infraspinatus 0   Subscapularis 0   Teres Minor 0   Teres Major    Pectoralis Major    Pectoralis Minor    Anterior Deltoid 1   Lateral Deltoid 1   Posterior Deltoid    Latissimus Dorsi    Sternocleidomastoid    (Blank rows = not tested) Graded on 0-4 scale (0 = no pain, 1 = pain, 2 = pain with wincing/grimacing/flinching, 3 = pain with withdrawal, 4 = unwilling to allow palpation), (Blank rows = not tested)  Accessory Motions/Glides Pt denies reproduction of shoulder pain with CPA C2-T7 and UPA bilaterally C2-T7. Intermittent hypomobility throughout thoracic spine but not reproduction of symptoms  SPECIAL TESTS Rotator Cuff  Drop Arm Test: Negative Painful Arc (Pain from 60 to 120 degrees scaption): Positive Infraspinatus Muscle Test: Negative If all 3 tests positive, the probability of a full-thickness rotator cuff tear is 91%  Subacromial Impingement Hawkins-Kennedy: Positive Neer (Block scapula, PROM flexion): Positive Painful Arc (Pain from 60 to 120 degrees scaption): Positive Empty Can: Negative External Rotation Resistance: Negative Horizontal Adduction: Not examined Scapular Assist: Positive  Labral Tear Biceps Load II (120 elevation, full ER, 90 elbow flexion, full supination, resisted elbow flexion): Not examined Crank (160 scaption, axial load with IR/ER): Not examined O'Briens/Active Compression Test (  90 shoulder flexion, 10 adduction, full IR): Not examined  Bicep Tendon Pathology Speed (shoulder flexion to 90, external rotation, full elbow extension, and forearm supination with resistance: Negative Yergason's (resisted shoulder ER and supination/biceps tendon pathology): Not examined  Shoulder Instability Sulcus Sign: Negative Anterior  Apprehension: Negative  Beighton scale Deferred   TODAY'S TREATMENT: 02/06/2024   SUBJECTIVE: Denies resting pain upon arrival today. He has been able to return to performing some of his home workout. He tried to do push-ups but experiences some pain at the bottom as he changes direction. No specific questions or concerns.    PAIN: Denies resting pain   Ther-ex  UBE x 5 minutes (2.5 forward/2.5 backward) for UE ROM and strengthening; Push-ups on parallel bar 2 x 10, limited ROM to minimize pain; Body Blade flexion and abduction 2 x 30s each; Body Blade ER at 90 abduction x 30s; Standing L shoulder overhead circles with 8# DB x 8; Prone I, Y, and T on pball with 4# DB 2 x 12 each; Prone L shoulder ER at 90 abuction with 4# DB x 10;   Manual Therapy   Supine thoracic grade V thrust manipulation at T7 with cavitation; Supine L GH AP mobilizations at 90 abduction and available end range ER, grade III, 30s/bout x 2 bouts; Supine L GH inferior mobilizations at 90 abduction, grade III, 30s/bout x 2 bouts; Prone L GH anterior mobilizations at 90 abduction to improve extension/IR, grade III, 30s/bout x 2 bouts; Prone L GH anterior mobilizations at 90 abduction and available ER grade III, 30s/bout x 2 bouts;   Not performed: Supine serratus punch with 4# DB 2 x 15; Wall slide with low trap lift off, green tband x 10; Standing W's with green tband x 10; Supine serratus punch with 4# DB on L 3 x 10; R sidelying L shoulder ER with 5# DB 3 x 10, pain at end of range so encouraged to stay out of painful range; Wall clocks with YTB around wrists (12, 3, 5 o'clock) 2 x 10  Standing forward B shoulder flexion with foam roll on wall and YTB around wrists x 10; Prone T3-T7 CPA, grade III, 30s/bout x 1 bouts at each level;  Seated Nautilus lat pull down 95# 2 x 15; Standing Nautilus L shoulder ER at waist 20# 2 x 15; Standing Nautilus L shoulder ER at shoulder 20# 2 x 15; Standing Nautilus L  shoulder IR at waist 20# 2 x 15;   PATIENT EDUCATION:  Education details: Pt educated throughout session about proper posture and technique with exercises. Improved exercise technique, movement at target joints, use of target muscles after min to mod verbal, visual, tactile cues.  Person educated: Patient Education method: Explanation, Demonstration, and Handouts Education comprehension: verbalized understanding   HOME EXERCISE PROGRAM:  Access Code: [email or web address] URL: [email or web address] Date: 01/09/2024 Prepared by: Selinda Eck  Exercises - Seated Shoulder Abduction Towel Slide at Table Top with Forearm in Neutral  - 2 x daily - 7 x weekly - 3-5 reps - 30-45s hold - Seated Shoulder Flexion Slide at Table Top with Forearm in Neutral  - 2 x daily - 7 x weekly - 3-5 reps - 30-45s hold - Supine Static Chest Stretch on Foam Roll  - 2 x daily - 7 x weekly - 3-5 reps - 30-45s hold - Supine Chest Stretch with Elbows Bent  - 2 x daily - 7 x weekly - 3-5 reps - 30-45s hold - Thoracic Extension Mobilization on Foam  Roll  - 2 x daily - 7 x weekly - 2 sets - 10 reps - 3-5s hold - Standing Shoulder Row with Anchored Resistance  - 1 x daily - 7 x weekly - 2 sets - 10 reps - 3-5s hold - Standing Low Trap Setting with Resistance at Wall  - 1 x daily - 7 x weekly - 2 sets - 10 reps - Shoulder External Rotation with Anchored Resistance  - 1 x daily - 7 x weekly - 2 sets - 10 reps - 3s hold - Seated Shoulder Scaption AAROM with Pulley at Side  - 1-2 x daily - 7 x weekly - 2 sets - 10 reps - Standing Shoulder Internal Rotation AAROM with Pulley (Mirrored)  - 1-2 x daily - 7 x weekly - 2 sets - 10 reps   ASSESSMENT:  CLINICAL IMPRESSION:   Continued PT POC focused on L shoulder pain. Focus remained on strengthening as well as manual techniques to improve L shoulder ROM. No HEP updates at this time. Sessions spread out to every other week given progress. Pt encouraged to follow-up as  scheduled. He will benefit from PT services to address deficits in ROM, strength, and pain in order to improve pain-free function at home and during exercise.  OBJECTIVE IMPAIRMENTS: decreased ROM, decreased strength, and pain.   ACTIVITY LIMITATIONS: carrying, lifting, reach over head, and caring for others  PARTICIPATION LIMITATIONS: community activity and exercise and golf  PERSONAL FACTORS: Time since onset of injury/illness/exacerbation are also affecting patient's functional outcome.   REHAB POTENTIAL: Excellent  CLINICAL DECISION MAKING: Stable/uncomplicated  EVALUATION COMPLEXITY: Low   GOALS: Goals reviewed with patient? Yes  SHORT TERM GOALS: Target date: 01/10/2024  Pt will be independent with HEP to improve strength and decrease shoulder pain to improve pain-free function at home and when exercising/playing golf Baseline:  Goal status: INITIAL   LONG TERM GOALS: Target date: 02/07/2024  Pt will decrease quick DASH score by at least 8% in order to demonstrate clinically significant reduction in disability related to shoulder pain  Baseline: 34.1% Goal status: INITIAL  2.  Pt will decrease worst shoulder pain by at least 3 points on the NPRS in order to demonstrate clinically significant reduction in shoulder pain. Baseline: 12/19/23: 4/10; Goal status: INITIAL  3.  Pt will increase L shoulder flexion, abduction, and ER to within 10 degrees of R side in order to improve functional overhead motion so he can complete all household responsibilities and exercise/golf without pain;     Baseline: see note for measurements Goal status: INITIAL  4. Pt will increase L shoulder pain-free abduction strength to 5/5 MMT grade in order lift weights and golf without pain.         Baseline: 4/5 with pain; Goal status: INITIAL   PLAN: PT FREQUENCY: 1-2x/week  PT DURATION: 8 weeks  PLANNED INTERVENTIONS: Therapeutic exercises, Therapeutic activity, Neuromuscular  re-education, Balance training, Gait training, Patient/Family education, Self Care, Joint mobilization, Joint manipulation, Vestibular training, Canalith repositioning, Orthotic/Fit training, DME instructions, Dry Needling, Electrical stimulation, Spinal manipulation, Spinal mobilization, Cryotherapy, Moist heat, Taping, Traction, Ultrasound, Ionotophoresis 4mg /ml Dexamethasone , Manual therapy, and Re-evaluation.  PLAN FOR NEXT SESSION: progress ROM and strength, manual techniques as needed. Progress/revise HEP as necessary;   Selinda JONETTA Eck PT, DPT, GCS  Physical Therapist - [facility]  Crawford Tamura, PT 02/06/2024, 11:24 AM  "

## 2024-02-20 ENCOUNTER — Ambulatory Visit

## 2024-02-20 DIAGNOSIS — M25512 Pain in left shoulder: Secondary | ICD-10-CM | POA: Diagnosis not present

## 2024-02-20 DIAGNOSIS — G8929 Other chronic pain: Secondary | ICD-10-CM

## 2024-02-20 NOTE — Therapy (Signed)
 " OUTPATIENT PHYSICAL THERAPY SHOULDER/ELBOW TREATMENT/RECERTIFICATION  Patient Name: Reginald Yoder MRN: [id number] DOB:[birth year], 44 y.o., male Today's Date: 02/20/2024  END OF SESSION:  PT End of Session - 02/20/24 0903     Visit Number 9    Number of Visits 33    Date for Recertification  04/16/24    Authorization Type eval: 12/13/23    PT Start Time 0802    PT Stop Time 0847    PT Time Calculation (min) 45 min    Activity Tolerance Patient tolerated treatment well    Behavior During Therapy [facility] for tasks assessed/performed          Past Medical History:  Diagnosis Date   Allergy    GERD (gastroesophageal reflux disease)    Past Surgical History:  Procedure Laterality Date   TONSILLECTOMY Bilateral 1987   VASECTOMY  2017   WISDOM TOOTH EXTRACTION Bilateral 2002   Patient Active Problem List   Diagnosis Date Noted   Right inguinal hernia 12/15/2020   Partial retinal tear of right eye without detachment 07/14/2019   Gastroesophageal reflux disease 07/03/2019   Environmental and seasonal allergies 07/03/2019   PCP: Dr. Edman   REFERRING PROVIDER: Dr. Edman   REFERRING DIAG:  M75.42 (ICD-10-CM) - Rotator cuff impingement syndrome of left shoulder  M25.512,G89.29 (ICD-10-CM) - Chronic left shoulder pain   RATIONALE FOR EVALUATION AND TREATMENT: Rehabilitation  THERAPY DIAG: Chronic left shoulder pain  ONSET DATE: Approximately 2 months ago  FOLLOW-UP APPT SCHEDULED WITH REFERRING PROVIDER: No   FROM INITIAL EVALUATION SUBJECTIVE:                                                                                                                                                                                         SUBJECTIVE STATEMENT:  L shoulder pain  PERTINENT HISTORY:  Pt referred for L shoulder pain which started approximately 2 months ago. At that time he was weight lifting approximately 3 days per week. He would exercise on alternating  days doing a general full body workout and would typically perform 3-5 sets of 5-6 reps for each exercises. He was having pain with all pressing-type movements and stopped lifting but the pain never resolved. Initially he was experiencing tingling in the L hand (digits 4 and 5). Pt also complains of L sided neck pain which he notices pain with cross body reaching especially wit his arm at shoulder height. No pain when reaching up his back. He has been working on ROM and resting since seeing Dr. Edman. Symptoms progressively improved initially but he has had two re-aggravating episodes.  He gets occasional L shoulder popping which offers  occasional relief. He has a history of right shoulder AC joint injury sustained during a football game as a teenager. No prior injuries to the left shoulder. He also reports Intermittent neck stiffness with an episode of a neck strain 5-6 months ago.    PAIN:  Pain Intensity: Present: 0/10, Best: 0/10, Worst: 4/10 Pain location: Lateral L shoulder Pain Quality: achy with intermittent sharp pain Radiating: Yes, possible radiation down posterior arm through tricep Numbness/Tingling: Yes, initially numbness in digits 4 and 5 of L hand but not currently Focal Weakness: No Aggravating factors: pressing motions (esp. overhead/incline), horizontal abduction, cross body horizontal adduction at shoulder height Relieving factors: Rest, heat, Naproxen  (he took it for 2 weeks with notable improvement) 24-hour pain behavior: activity dependent History of prior shoulder or neck/shoulder injury, pain, surgery, or therapy: No Dominant hand: right (also plays golf right handed); Imaging: No  Red flags: Negative for personal history of cancer, chills/fever, night sweats, nausea, vomiting, unrelenting pain, unexplained weight gain/loss, chest pain/palpitations, SOB  PRECAUTIONS: None  WEIGHT BEARING RESTRICTIONS: No  Living Environment Lives with: lives with their family,  wife and three kids  Prior level of function: Independent  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  Recent memory is intact.  Remote memory is intact.  Attention span and concentration are intact.  Expressive speech is intact.  Patient's fund of knowledge is within normal limits for educational level.    Gross Musculoskeletal Assessment Tremor: None Bulk: Normal Tone: Normal  Gait Deferred  Posture No gross deficits noted  Cervical Screen AROM: WFL and painless with overpressure in all planes Spurlings A (ipsilateral lateral flexion/axial compression): R: Negative L: Negative Spurlings B (ipsilateral lateral flexion/contralateral rotation/axial compression): R: Negative L: Negative Hoffman Sign (cervical cord compression): R: Positive L: Positive ULTT: Attempted but unable to perform due to pain initially with shoulder depression;  Accessory Motions/Glides Glenohumeral: Posterior: R: normal L: Hypomobile Inferior: R: normal L: Hypomobile Anterior: R: not examined L: not examined  Acromioclavicular:  Posterior: R: not examined L: normal Anterior: R: not examined L: normal  Sternoclavicular: Posterior: R: not examined L: normal Anterior: R: not examined L: normal Superior: R: not examined L: normal Inferior: R: not examined L: normal  Scapulothoracic: Distraction: R: not examined L: not examined Medial: R: not examined L: normal Lateral: R: not examined L: normal Inferior: R: not examined L: normal Superior: R: not examined L: normal Upward Rotation: R: not examined L: normal Downward Rotation: R: not examined L: normal  AROM AROM  (Normal range in degrees) AROM  Cervical  Flexion (50) WNL  Extension (80) WNL  Right lateral flexion (45) WNL  Left lateral flexion (45) WNL  Right rotation (85) WNL  Left rotation (85) WNL   Right Left  Shoulder    Flexion 171 155*  Extension    Abduction 184 98*  External Rotation 92 70*  Internal Rotation 55 50*  Hands Behind Head    Hands Behind Back        Elbow    Flexion WNL WNL  Extension WNL WNL  Pronation    Supination    (* = pain; Blank rows = not tested)  Painful arc in flexion starts at 80 degrees; Painful arc in abduction starts at 77 degrees;  AROM (01/24/24) AROM (Normal range in degrees) AROM   Right Left  Shoulder    Flexion 169 158*  Extension    Abduction 182 150*  External Rotation 96 85*  Internal Rotation 58 52  Hands Behind Head    Hands Behind Back    (* = pain; Blank rows = not tested)  UE MMT: MMT (out of 5) Right Left   Cervical (isometric)  Flexion WNL  Extension WNL  Lateral Flexion WNL WNL  Rotation WNL WNL      Shoulder   Flexion 5 5*  Extension    Abduction 5 4*  External rotation 5 5  Internal rotation 5 5  Horizontal abduction    Horizontal adduction    Lower Trapezius    Rhomboids        Elbow  Flexion 5 5  Extension 5 5  Pronation 5 5  Supination 5 5*      Wrist  Flexion 5 5  Extension 5 5      MCP  Flexion 5 5  Extension 5 5  Abduction 5 5  Adduction 5 5  (* = pain; Blank rows = not tested)  Hand behind back: R: T6  L: T1 Hand behind head: R: T7 L: T12   UE MMT: MMT (out of 5) Right Left   Shoulder   Extension 4 4  Horizontal abduction 4 4  Horizontal adduction    Lower Trapezius 4 Unable to test due to pain  Rhomboids 5 5  (* = pain; Blank rows = not tested)  Sensation Grossly intact to light touch bilateral UE as determined by testing dermatomes C2-T2. Proprioception and hot/cold testing deferred on this date.  Reflexes Deferred  Palpation Location LEFT  RIGHT            Subocciptials    Cervical paraspinals    Upper Trapezius    Levator Scapulae    Rhomboid Major/Minor    Sternoclavicular joint    Acromioclavicular joint 0   Coracoid process    Long head of biceps 1   Supraspinatus 0   Infraspinatus 0   Subscapularis 0   Teres Minor 0   Teres Major    Pectoralis Major    Pectoralis Minor    Anterior Deltoid 1   Lateral Deltoid 1   Posterior Deltoid    Latissimus Dorsi    Sternocleidomastoid    (Blank rows = not tested) Graded on 0-4 scale (0 = no pain, 1 = pain, 2 = pain with wincing/grimacing/flinching, 3 = pain with withdrawal, 4 = unwilling to allow palpation), (Blank rows = not tested)  Accessory Motions/Glides Pt denies reproduction of shoulder pain with CPA C2-T7 and UPA bilaterally C2-T7. Intermittent hypomobility throughout thoracic spine but not reproduction of symptoms  SPECIAL TESTS Rotator Cuff  Drop Arm Test: Negative Painful Arc (Pain from 60 to 120 degrees scaption): Positive Infraspinatus Muscle Test: Negative If all 3 tests positive, the probability of a full-thickness rotator cuff tear is 91%  Subacromial Impingement Hawkins-Kennedy: Positive Neer (Block scapula, PROM flexion): Positive Painful Arc (Pain from 60 to 120 degrees scaption): Positive Empty Can: Negative External Rotation Resistance: Negative Horizontal Adduction: Not examined Scapular Assist: Positive  Labral Tear Biceps Load II (120 elevation, full ER, 90 elbow flexion, full supination, resisted elbow flexion): Not examined Crank (160 scaption, axial load with IR/ER): Not examined O'Briens/Active Compression Test (  90 shoulder flexion, 10 adduction, full IR): Not examined  Bicep Tendon Pathology Speed (shoulder flexion to 90, external rotation, full elbow extension, and forearm supination with resistance: Negative Yergason's (resisted shoulder ER and supination/biceps tendon pathology): Not examined  Shoulder Instability Sulcus Sign:  Negative Anterior Apprehension: Negative  Beighton scale Deferred   TODAY'S TREATMENT: 02/20/24   SUBJECTIVE: Denies resting pain upon arrival today. He has been able exercising at home and consistent with his HEP. He has been working consistently on external rotation and has noticed considerably improvement but still unable to rack a bar to back squat or shoulder press overhead. He has noticed that his incline push-ups are no longer painful. No specific questions or concerns.    PAIN: Denies resting pain   Ther-ex  UBE x 5 minutes (2.5 forward/2.5 backward) for UE ROM and strengthening; Push-ups on parallel bar x 15, limited ROM to minimize pain; Push-ups on parallel bar x 15, limited ROM to minimize pain; Seated Nautilus lat pull down 95# x 15, 125# x 15, 140# x 15;   Manual Therapy   Supine L GH AP mobilizations at 90 abduction and available end range ER, grade III, 30s/bout x 2 bouts; Supine L GH inferior mobilizations at 90 abduction, grade III, 30s/bout x 2 bouts; Prone L GH anterior mobilizations at 90 abduction to improve extension/IR, grade III, 30s/bout x 2 bouts; Prone L GH anterior mobilizations at 90 abduction and available ER grade III, 30s/bout x 2 bouts; STM to L infraspinatus and posterior deltoid as well as mid trap, rhomboid major, rhomboid minor, and subscapularis Supine thoracic grade V manipulation at T5 with cavitation;   Not performed: Supine serratus punch with 4# DB 2 x 15; Wall slide with low trap lift off, green tband x 10; Standing W's with green tband x 10; Supine serratus punch with 4# DB on L 3 x 10; R sidelying L shoulder ER with 5# DB 3 x 10, pain at end of range so encouraged to stay out of painful range; Wall clocks with YTB around wrists (12, 3, 5 o'clock) 2 x 10  Standing forward B shoulder flexion with foam roll on wall and YTB around wrists x 10; Prone T3-T7 CPA, grade III, 30s/bout x 1 bouts at each level;  Standing Nautilus L shoulder  ER at waist 20# 2 x 15; Standing Nautilus L shoulder ER at shoulder 20# 2 x 15; Standing Nautilus L shoulder IR at waist 20# 2 x 15; Body Blade flexion and abduction 2 x 30s each; Body Blade ER at 90 abduction x 30s; Standing L shoulder overhead circles with 8# DB x 8; Prone I, Y, and T on pball with 4# DB 2 x 12 each; Prone L shoulder ER at 90 abuction with 4# DB x 10;   PATIENT EDUCATION:  Education details: Pt educated throughout session about proper posture and technique with exercises. Improved exercise technique, movement at target joints, use of target muscles after min to mod verbal, visual, tactile cues.  Person educated: Patient Education method: Medical Illustrator Education comprehension: verbalized understanding   HOME EXERCISE PROGRAM:  Access Code: [email or web address] URL: [email or web address] Date: 01/09/2024 Prepared by: Selinda Eck  Exercises - Seated Shoulder Abduction Towel Slide at Table Top with Forearm in Neutral  - 2 x daily - 7 x weekly - 3-5 reps - 30-45s hold - Seated Shoulder Flexion Slide at Table Top with Forearm in Neutral  - 2 x daily - 7 x weekly - 3-5 reps - 30-45s  hold - Supine Static Chest Stretch on Foam Roll  - 2 x daily - 7 x weekly - 3-5 reps - 30-45s hold - Supine Chest Stretch with Elbows Bent  - 2 x daily - 7 x weekly - 3-5 reps - 30-45s hold - Thoracic Extension Mobilization on Foam Roll  - 2 x daily - 7 x weekly - 2 sets - 10 reps - 3-5s hold - Standing Shoulder Row with Anchored Resistance  - 1 x daily - 7 x weekly - 2 sets - 10 reps - 3-5s hold - Standing Low Trap Setting with Resistance at Wall  - 1 x daily - 7 x weekly - 2 sets - 10 reps - Shoulder External Rotation with Anchored Resistance  - 1 x daily - 7 x weekly - 2 sets - 10 reps - 3s hold - Seated Shoulder Scaption AAROM with Pulley at Side  - 1-2 x daily - 7 x weekly - 2 sets - 10 reps - Standing Shoulder Internal Rotation AAROM with Pulley (Mirrored)  - 1-2 x  daily - 7 x weekly - 2 sets - 10 reps   ASSESSMENT:  CLINICAL IMPRESSION:   Pt is making excellent progress with therapy. Continued PT POC focused on L shoulder ROM and strengthen deficits. Progressed strengthening as well as manual techniques to improve L shoulder ROM. External rotation is looking significantly improved but IR still appears limited. Pt encouraged to add focus on L shoulder IR during HEP. Otherwise, no HEP updates at this time. Sessions maintained at frequently of every other week given progress. Pt encouraged to follow-up as scheduled. He will benefit from PT services to address deficits in ROM, strength, and pain in order to improve pain-free function at home and during exercise.  OBJECTIVE IMPAIRMENTS: decreased ROM, decreased strength, and pain.   ACTIVITY LIMITATIONS: carrying, lifting, reach over head, and caring for others  PARTICIPATION LIMITATIONS: community activity and exercise and golf  PERSONAL FACTORS: Time since onset of injury/illness/exacerbation are also affecting patient's functional outcome.   REHAB POTENTIAL: Excellent  CLINICAL DECISION MAKING: Stable/uncomplicated  EVALUATION COMPLEXITY: Low   GOALS: Goals reviewed with patient? Yes  SHORT TERM GOALS: Target date:   Pt will be independent with HEP to improve strength and decrease shoulder pain to improve pain-free function at home and when exercising/playing golf Baseline:  Goal status: ACHIEVED   LONG TERM GOALS: Target date: 04/16/2024  Pt will decrease quick DASH score by at least 8% in order to demonstrate clinically significant reduction in disability related to shoulder pain  Baseline: 34.1% Goal status: INITIAL  2.  Pt will decrease worst shoulder pain by at least 3 points on the NPRS in order to demonstrate clinically significant reduction in shoulder pain. Baseline: 12/19/23: 4/10; Goal status: INITIAL  3.  Pt will increase L shoulder flexion, abduction, and ER to within 10  degrees of R side in order to improve functional overhead motion so he can complete all household responsibilities and exercise/golf without pain;     Baseline: see note for measurements Goal status: INITIAL  4. Pt will increase L shoulder pain-free abduction strength to 5/5 MMT grade in order lift weights and golf without pain.         Baseline: 4/5 with pain; Goal status: INITIAL   PLAN: PT FREQUENCY: 1-2x/week  PT DURATION: 8 weeks  PLANNED INTERVENTIONS: Therapeutic exercises, Therapeutic activity, Neuromuscular re-education, Balance training, Gait training, Patient/Family education, Self Care, Joint mobilization, Joint manipulation, Vestibular training, Canalith repositioning, Orthotic/Fit  training, DME instructions, Dry Needling, Electrical stimulation, Spinal manipulation, Spinal mobilization, Cryotherapy, Moist heat, Taping, Traction, Ultrasound, Ionotophoresis 4mg /ml Dexamethasone , Manual therapy, and Re-evaluation.  PLAN FOR NEXT SESSION: progress ROM and strength, manual techniques as needed. Progress/revise HEP as necessary;   Selinda JONETTA Eck PT, DPT, GCS  Physical Therapist - [place]  [facility]  Della Scrivener, PT 02/20/2024, 9:15 AM  "

## 2024-03-05 ENCOUNTER — Ambulatory Visit: Attending: Family Medicine

## 2024-04-04 ENCOUNTER — Encounter: Admitting: Family Medicine
